# Patient Record
Sex: Female | Born: 1985 | Race: Black or African American | Hispanic: No | Marital: Single | Smoking: Current every day smoker
Health system: Southern US, Community
[De-identification: ages and names within clinical notes are randomized; demographics above are authoritative.]

## PROBLEM LIST (undated history)

## (undated) DIAGNOSIS — O24419 Gestational diabetes mellitus in pregnancy, unspecified control: Secondary | ICD-10-CM

## (undated) DIAGNOSIS — D649 Anemia, unspecified: Secondary | ICD-10-CM

## (undated) DIAGNOSIS — F419 Anxiety disorder, unspecified: Secondary | ICD-10-CM

## (undated) DIAGNOSIS — N809 Endometriosis, unspecified: Secondary | ICD-10-CM

## (undated) DIAGNOSIS — B059 Measles without complication: Secondary | ICD-10-CM

## (undated) DIAGNOSIS — O99345 Other mental disorders complicating the puerperium: Secondary | ICD-10-CM

## (undated) DIAGNOSIS — F53 Postpartum depression: Secondary | ICD-10-CM

## (undated) HISTORY — DX: Other mental disorders complicating the puerperium: O99.345

## (undated) HISTORY — DX: Gestational diabetes mellitus in pregnancy, unspecified control: O24.419

## (undated) HISTORY — PX: WISDOM TOOTH EXTRACTION: SHX21

## (undated) HISTORY — DX: Postpartum depression: F53.0

## (undated) HISTORY — DX: Anxiety disorder, unspecified: F41.9

---
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
## 2007-08-03 ENCOUNTER — Emergency Department: Payer: Self-pay | Admitting: Internal Medicine

## 2007-09-18 ENCOUNTER — Emergency Department: Payer: Self-pay | Admitting: Emergency Medicine

## 2009-02-05 ENCOUNTER — Emergency Department: Payer: Self-pay | Admitting: Emergency Medicine

## 2010-03-03 ENCOUNTER — Emergency Department: Payer: Self-pay | Admitting: Emergency Medicine

## 2010-05-31 ENCOUNTER — Ambulatory Visit: Payer: Self-pay | Admitting: Family Medicine

## 2010-09-03 ENCOUNTER — Encounter: Payer: Self-pay | Admitting: Family Medicine

## 2010-09-15 ENCOUNTER — Encounter: Payer: Self-pay | Admitting: Family Medicine

## 2010-09-15 DIAGNOSIS — N809 Endometriosis, unspecified: Secondary | ICD-10-CM

## 2010-09-15 HISTORY — DX: Endometriosis, unspecified: N80.9

## 2010-11-13 ENCOUNTER — Emergency Department: Payer: Self-pay | Admitting: Internal Medicine

## 2011-04-21 ENCOUNTER — Emergency Department: Payer: Self-pay | Admitting: Emergency Medicine

## 2012-04-18 ENCOUNTER — Emergency Department: Payer: Self-pay | Admitting: Emergency Medicine

## 2012-04-18 LAB — URINALYSIS, COMPLETE
Bilirubin,UR: NEGATIVE
Ketone: NEGATIVE
Leukocyte Esterase: NEGATIVE
Ph: 6 (ref 4.5–8.0)
Protein: NEGATIVE
RBC,UR: 1 /HPF (ref 0–5)
Specific Gravity: 1.025 (ref 1.003–1.030)
Squamous Epithelial: 4

## 2012-07-08 ENCOUNTER — Ambulatory Visit: Payer: Self-pay

## 2012-07-25 ENCOUNTER — Emergency Department: Payer: Self-pay | Admitting: Emergency Medicine

## 2012-11-05 ENCOUNTER — Emergency Department: Payer: Self-pay | Admitting: Emergency Medicine

## 2013-05-22 ENCOUNTER — Emergency Department: Payer: Self-pay | Admitting: Internal Medicine

## 2013-06-26 ENCOUNTER — Emergency Department: Payer: Self-pay | Admitting: Emergency Medicine

## 2013-08-21 ENCOUNTER — Emergency Department: Payer: Self-pay | Admitting: Emergency Medicine

## 2013-09-16 ENCOUNTER — Emergency Department: Payer: Self-pay | Admitting: Emergency Medicine

## 2013-09-16 LAB — COMPREHENSIVE METABOLIC PANEL
ALBUMIN: 3.7 g/dL (ref 3.4–5.0)
ALK PHOS: 63 U/L
Anion Gap: 3 — ABNORMAL LOW (ref 7–16)
BUN: 11 mg/dL (ref 7–18)
Bilirubin,Total: 0.4 mg/dL (ref 0.2–1.0)
CHLORIDE: 106 mmol/L (ref 98–107)
Calcium, Total: 9.5 mg/dL (ref 8.5–10.1)
Co2: 27 mmol/L (ref 21–32)
Creatinine: 0.71 mg/dL (ref 0.60–1.30)
EGFR (African American): >60
EGFR (Non-African Amer.): >60
Glucose: 86 mg/dL (ref 65–99)
OSMOLALITY: 271 (ref 275–301)
Potassium: 3.2 mmol/L — ABNORMAL LOW (ref 3.5–5.1)
SGOT(AST): 22 U/L (ref 15–37)
SGPT (ALT): 26 U/L (ref 12–78)
Sodium: 136 mmol/L (ref 136–145)
Total Protein: 7.6 g/dL (ref 6.4–8.2)

## 2013-09-16 LAB — URINALYSIS, COMPLETE
BACTERIA: NONE SEEN
Bilirubin,UR: NEGATIVE
Blood: NEGATIVE
Glucose,UR: NEGATIVE mg/dL (ref 0–75)
KETONE: NEGATIVE
Nitrite: NEGATIVE
Ph: 5 (ref 4.5–8.0)
Protein: NEGATIVE
Specific Gravity: 1.026 (ref 1.003–1.030)

## 2013-09-16 LAB — CBC WITH DIFFERENTIAL/PLATELET
BASOS ABS: 0 10*3/uL (ref 0.0–0.1)
Basophil %: 0.5 %
EOS ABS: 0.1 10*3/uL (ref 0.0–0.7)
Eosinophil %: 0.9 %
HCT: 36 % (ref 35.0–47.0)
HGB: 12.2 g/dL (ref 12.0–16.0)
LYMPHS PCT: 39.5 %
Lymphocyte #: 3.2 10*3/uL (ref 1.0–3.6)
MCH: 29.6 pg (ref 26.0–34.0)
MCHC: 33.8 g/dL (ref 32.0–36.0)
MCV: 87 fL (ref 80–100)
MONO ABS: 0.6 x10 3/mm (ref 0.2–0.9)
Monocyte %: 7 %
Neutrophil #: 4.2 10*3/uL (ref 1.4–6.5)
Neutrophil %: 52.1 %
Platelet: 251 10*3/uL (ref 150–440)
RBC: 4.12 10*6/uL (ref 3.80–5.20)
RDW: 13.1 % (ref 11.5–14.5)
WBC: 8 10*3/uL (ref 3.6–11.0)

## 2013-09-16 LAB — LIPASE, BLOOD: Lipase: 163 U/L (ref 73–393)

## 2013-11-23 ENCOUNTER — Emergency Department: Payer: Self-pay | Admitting: Emergency Medicine

## 2014-03-23 ENCOUNTER — Ambulatory Visit: Payer: Self-pay

## 2014-04-15 ENCOUNTER — Ambulatory Visit: Payer: Self-pay

## 2014-05-12 ENCOUNTER — Observation Stay: Payer: Self-pay | Admitting: Obstetrics and Gynecology

## 2014-05-14 ENCOUNTER — Observation Stay: Payer: Self-pay

## 2014-05-15 ENCOUNTER — Inpatient Hospital Stay: Payer: Self-pay

## 2014-05-15 LAB — CBC WITH DIFFERENTIAL/PLATELET
BASOS ABS: 0 10*3/uL (ref 0.0–0.1)
Basophil %: 0.3 %
EOS ABS: 0 10*3/uL (ref 0.0–0.7)
EOS PCT: 0.1 %
HCT: 32.8 % — AB (ref 35.0–47.0)
HGB: 10.2 g/dL — AB (ref 12.0–16.0)
Lymphocyte #: 1.6 10*3/uL (ref 1.0–3.6)
Lymphocyte %: 15.9 %
MCH: 26.2 pg (ref 26.0–34.0)
MCHC: 31.1 g/dL — ABNORMAL LOW (ref 32.0–36.0)
MCV: 84 fL (ref 80–100)
Monocyte #: 0.9 x10 3/mm (ref 0.2–0.9)
Monocyte %: 9.2 %
NEUTROS PCT: 74.5 %
Neutrophil #: 7.6 10*3/uL — ABNORMAL HIGH (ref 1.4–6.5)
PLATELETS: 241 10*3/uL (ref 150–440)
RBC: 3.9 10*6/uL (ref 3.80–5.20)
RDW: 15.1 % — ABNORMAL HIGH (ref 11.5–14.5)
WBC: 10.2 10*3/uL (ref 3.6–11.0)

## 2014-05-17 LAB — HEMATOCRIT: HCT: 27 % — ABNORMAL LOW (ref 35.0–47.0)

## 2015-01-06 NOTE — Op Note (Signed)
PATIENT NAME:  Jean Mckay, Jakerra MR#:  [id number] DATE OF BIRTH:  Oct 26, 1985  DATE OF PROCEDURE:  05/16/2014  PREOPERATIVE DIAGNOSIS:  Term intrauterine pregnancy, failure to progress.  POSTOPERATIVE DIAGNOSIS: Term intrauterine pregnancy, failure to progress.  PROCEDURE:   1.  Low transverse cesarean section.  2.  Placement of On-Q pain pump.   SURGEON: Dierdre Searles. Paul Harris, MD   ASSISTANT: S. [address]  ANESTHESIA: Epidural.   ESTIMATED BLOOD LOSS: 500 mL.   COMPLICATIONS: None.   FINDINGS: Normal tubes, ovaries, and uterus. Viable female infant weighing 8 pounds with Apgar scores of 8, 6, and 8 at one, five, and ten minutes, respectively.   DISPOSITION: To the recovery room in stable condition.   TECHNIQUE: The patient is prepped and draped in the usual sterile fashion after adequate anesthesia is obtained, in the supine position on the operating room table. Scalpel was used to create a low transverse skin incision down to the level of the rectus fascia which was then dissected bilaterally using Mayo scissors. Rectus muscles are separated from the midline. The peritoneum is penetrated and the bladder is inferiorly dissected and retracted. A scalpel was used to create a low transverse hysterotomy incision that is then extended by blunt dissection. The amniotomy reveals clear fluid. The infant's head is grasped and delivered without complication. A nuchal cord is reduced. The remaining portion of the infant is then delivered.   Cord blood is obtained and placenta is manually extracted. The uterus is externalized and cleansed of all membranes and debris using a moist sponge. Hysterotomy incision is closed with running 1 Vicryl suture in a locking fashion, followed by a second layer to imbricate the first layer with excellent hemostasis noted. The uterus is placed back in the intra-abdominal cavity and the paracolic gutters are irrigated with warm saline. Re-examination of the incision reveals  excellent hemostasis. The peritoneum is closed. Trocars are placed through the abdomen into the subfascial space and the SilverSoaker catheters associated with the On-Q pain pump were then placed. The rectus fascia was closed with a 0 Maxon suture. Subcutaneous tissues are irrigated and hemostasis is assured using electrocautery. Skin is closed with a 4-0 Vicryl suture in a subcuticular fashion followed by placement of Dermabond. The catheters are flushed with 5 mL each of bupivacaine and then stabilized into place with Steri-Strips and a Tegaderm bandage. The patient tolerates the procedure well and goes to the recovery room in stable condition. All sponge, instrument, and needle counts are correct    ____________________________ R. Annamarie MajorPaul Harris, MD rph:LT D: 05/16/2014 18:32:20 ET T: 05/16/2014 21:54:17 ET JOB#: [id number]  cc: Dierdre Searles. Paul Harris, MD, <Dictator> Nadara MustardOBERT P HARRIS MD ELECTRONICALLY SIGNED 05/17/2014 5:28

## 2015-01-23 NOTE — H&P (Signed)
L&D Evaluation:  History Expanded:  HPI 29 yo G2P1001 at 3195w3d gestational age by LMP consistent with 9 wk ultrasound. Pregnancy complicated by GDM (White classification A1), obesity with BMI 31.  She has not consistently brought in her blood glucose log, but states that her values are typically normal.  SHe presents "just feeling bad" all day today. She began having cramps yesterday.  She notes positive fetal movement, no leakage of fluid, no vaginal bleeding.  Last growth ultrasound was on 8/14 with growth 41st %ile, AFI 17.   Blood Type (Maternal) B positive   Group B Strep Results Maternal (Result >5wks must be treated as unknown) positive   Maternal HIV Negative   Maternal Syphilis Ab Nonreactive   Maternal Varicella Immune   Rubella Results (Maternal) immune   EDC 17-May-2014   Patient's Medical History 1) anxiety, 2) postpartum depression   Patient's Surgical History none   Medications Pre Natal Vitamins   Allergies NKDA   Social History former tobacco, denies EtOH and drugs   Family History Non-Contributory   ROS:  ROS All systems were reviewed.  HEENT, CNS, GI, GU, Respiratory, CV, Renal and Musculoskeletal systems were found to be normal., unless otherwise noted in HPI   Exam:  Vital Signs BP 119/70, P85   General no apparent distress   Mental Status clear   Chest moves air well   Heart normal sinus rhythm   Abdomen gravid, non-tender   Fetal Position ceph   Back no CVAT   Edema no edema   Pelvic no external lesions, 4/50/-2 per RN   Mebranes Intact   FHT normal rate with no decels   FHT Description baseline 125/mod var/+ 15x15 accels/no decels   Ucx not tracing well   Skin no lesions   Impression:  Impression 1) Intrauterine pregnancy at 1195w3d gestational age, 2) 51DMA1, 3) rule out labor   Plan:  Plan monitor contractions and for cervical change   Electronic Signatures: Conard NovakJackson, Rosealyn Little D (MD)  (Signed 28-Aug-15 23:07)  Authored:  L&D Evaluation   Last Updated: 28-Aug-15 23:07 by Conard NovakJackson, Dianne Bady D (MD)

## 2015-01-23 NOTE — H&P (Signed)
L&D Evaluation:  History:  HPI 29 yo G2P1001 at 8561w6d gestational age by LMP consistent with 9 wk ultrasound. Pregnancy complicated by GDM (White classification A1), obesity with BMI 31.  She has not consistently brought in her blood glucose log, but states that her values are typically normal.  Last growth ultrasound was on 8/14 with growth 41st %ile, AFI 17. She presents today after being sent over from the office for IOL for diet controlled GDM. Her prenatal labs are: B+, RI, VI, GBS positive. She reports +FM, and some occassional braxton hicks contractions, denies vb or lof.   Presents with other, IOL for GDM   Patient's Medical History 1) anxiety, 2) postpartum depression   Patient's Surgical History none   Medications Pre Natal Vitamins   Allergies NKDA   Social History former tobacco, denies EtOH and drugs   Family History Non-Contributory   ROS:  ROS All systems were reviewed.  HEENT, CNS, GI, GU, Respiratory, CV, Renal and Musculoskeletal systems were found to be normal., unless otherwise noted in HPI   Exam:  Vital Signs stable   General no apparent distress   Mental Status clear   Heart normal sinus rhythm   Abdomen gravid, non-tender   Back no CVAT   Edema no edema   Pelvic no external lesions, 3-4/50/-2 per RN consistant with exam in office today   Mebranes Intact   FHT normal rate with no decels   FHT Description baseline 140's +accels, no decels noted   Ucx irregular, every 8   Skin dry, no lesions, no rashes   Lymph no lymphadenopathy   Impression:  Impression 1) Intrauterine pregnancy at 861w6d gestational age, 2) 39DMA1, 143) Cat 1 FHT   Plan:  Plan EFM/NST, antibiotics for GBBS prophylaxis, Start pitocin for IOL, anticipate SVD   Follow Up Appointment need to schedule   Electronic Signatures: Jannet MantisSubudhi, Khylan Sawyer (CNM)  (Signed 31-Aug-15 17:14)  Authored: L&D Evaluation   Last Updated: 31-Aug-15 17:14 by Jannet MantisSubudhi, Jorrell Kuster (CNM)

## 2015-05-12 ENCOUNTER — Emergency Department
Admission: EM | Admit: 2015-05-12 | Discharge: 2015-05-12 | Disposition: A | Discharge status: Home / Self Care | Payer: Self-pay | Attending: Emergency Medicine | Admitting: Emergency Medicine

## 2015-05-12 ENCOUNTER — Encounter: Payer: Self-pay | Admitting: Emergency Medicine

## 2015-05-12 ENCOUNTER — Emergency Department: Payer: Self-pay

## 2015-05-12 DIAGNOSIS — Z3202 Encounter for pregnancy test, result negative: Secondary | ICD-10-CM | POA: Insufficient documentation

## 2015-05-12 DIAGNOSIS — N83202 Unspecified ovarian cyst, left side: Secondary | ICD-10-CM

## 2015-05-12 DIAGNOSIS — K5901 Slow transit constipation: Secondary | ICD-10-CM | POA: Insufficient documentation

## 2015-05-12 DIAGNOSIS — N8320 Unspecified ovarian cysts: Secondary | ICD-10-CM | POA: Insufficient documentation

## 2015-05-12 DIAGNOSIS — R109 Unspecified abdominal pain: Secondary | ICD-10-CM

## 2015-05-12 LAB — URINALYSIS COMPLETE WITH MICROSCOPIC (ARMC ONLY)
Bilirubin Urine: NEGATIVE
GLUCOSE, UA: NEGATIVE mg/dL
Ketones, ur: NEGATIVE mg/dL
Leukocytes, UA: NEGATIVE
NITRITE: NEGATIVE
Protein, ur: NEGATIVE mg/dL
Specific Gravity, Urine: 1.025 (ref 1.005–1.030)
pH: 6 (ref 5.0–8.0)

## 2015-05-12 LAB — POCT PREGNANCY, URINE: PREG TEST UR: NEGATIVE

## 2015-05-12 MED ORDER — KETOROLAC TROMETHAMINE 60 MG/2ML IM SOLN
60.0000 mg | Freq: Once | INTRAMUSCULAR | Status: AC
  Administered 2015-05-12: 60 mg via INTRAMUSCULAR
  Filled 2015-05-12: qty 2

## 2015-05-12 MED ORDER — ONDANSETRON 8 MG PO TBDP
8.0000 mg | ORAL_TABLET | Freq: Once | ORAL | Status: AC
  Administered 2015-05-12: 8 mg via ORAL
  Filled 2015-05-12: qty 1

## 2015-05-12 MED ORDER — DOCUSATE SODIUM 100 MG PO CAPS: 100.0000 mg | ORAL_CAPSULE | Freq: Every day | ORAL | Status: AC | PRN

## 2015-05-12 MED ORDER — TRAMADOL HCL 50 MG PO TABS
50.0000 mg | ORAL_TABLET | Freq: Once | ORAL | Status: AC
  Administered 2015-05-12: 50 mg via ORAL
  Filled 2015-05-12: qty 1

## 2015-05-12 MED ORDER — IBUPROFEN 800 MG PO TABS: 800.0000 mg | ORAL_TABLET | Freq: Three times a day (TID) | ORAL | Status: DC | PRN

## 2015-05-12 MED ORDER — OXYCODONE-ACETAMINOPHEN 5-325 MG PO TABS: 1.0000 | ORAL_TABLET | Freq: Four times a day (QID) | ORAL | Status: DC | PRN

## 2015-05-12 NOTE — ED Notes (Addendum)
Pt with left flank pain that came on suddenly. Denies changes in urination or bowel habits. Appears in acute pain. Pain not relieved by position. Left flank tender to touch. Pain improves by not moving or breathing

## 2015-05-12 NOTE — ED Provider Notes (Signed)
[facility] Emergency Department Provider Note  ____________________________________________  Time seen: Approximately 12:20 PM  I have reviewed the triage vital signs and the nursing notes.   HISTORY  Chief Complaint Back Pain    HPI Jean Mckay is a 29 y.o. female acute onset of left flank pain this morning. Patient denies any urinary complaints. Patient states movement increases the pain and keep instilled decreases the pain. Patient denies any radicular component to this complaint. She denies any bowel dysfunction. Patient is rating the pain as a 9/10 describe the sharp.Patient also state menstrual cycle finished yesterday.   History reviewed. No pertinent past medical history.  There are no active problems to display for this patient.   History reviewed. No pertinent past surgical history.  No current outpatient prescriptions on file.  Allergies Pork-derived products  History reviewed. No pertinent family history.  Social History Social History  Substance Use Topics  . Smoking status: Never Smoker   . Smokeless tobacco: None  . Alcohol Use: No    Review of Systems Constitutional: No fever/chills Eyes: No visual changes. ENT: No sore throat. Cardiovascular: Denies chest pain. Respiratory: Denies shortness of breath. Gastrointestinal: No abdominal pain.  No nausea, no vomiting.  No diarrhea.  No constipation. Genitourinary: Negative for dysuria. Musculoskeletal: Left flank pain Skin: Negative for rash. Neurological: Negative for headaches, focal weakness or numbness. 10-point ROS otherwise negative.  ____________________________________________   PHYSICAL EXAM:  VITAL SIGNS: ED Triage Vitals  Enc Vitals Group     BP 05/12/15 1146 118/86 mmHg     Pulse Rate 05/12/15 1146 73     Resp 05/12/15 1146 20     Temp 05/12/15 1146 98.2 F (36.8 C)     Temp Source 05/12/15 1146 Oral     SpO2 05/12/15 1146 100 %     Weight  05/12/15 1146 122 lb (55.339 kg)     Height 05/12/15 1146 5' (1.524 m)     Head Cir --      Peak Flow --      Pain Score 05/12/15 1142 9     Pain Loc --      Pain Edu? --      Excl. in GC? --     Constitutional: Alert and oriented. Well appearing and in no acute distress. Eyes: Conjunctivae are normal. PERRL. EOMI. Head: Atraumatic. Nose: No congestion/rhinnorhea. Mouth/Throat: Mucous membranes are moist.  Oropharynx non-erythematous. Neck: No stridor.  No cervical spine tenderness to palpation. Hematological/Lymphatic/Immunilogical: No cervical lymphadenopathy. Cardiovascular: Normal rate, regular rhythm. Grossly normal heart sounds.  Good peripheral circulation. Respiratory: Normal respiratory effort.  No retractions. Lungs CTAB. Gastrointestinal: Soft and nontender. No distention. No abdominal bruits. No CVA tenderness. Genitourinary:  Musculoskeletal: No lower extremity tenderness nor edema.  No joint effusions. Neurologic:  Normal speech and language. No gross focal neurologic deficits are appreciated. No gait instability. Skin:  Skin is warm, dry and intact. No rash noted. Psychiatric: Mood and affect are normal. Speech and behavior are normal.  ____________________________________________   LABS (all labs ordered are listed, but only abnormal results are displayed)  Labs Reviewed  URINALYSIS COMPLETEWITH MICROSCOPIC ([facility] ONLY) - Abnormal; Notable for the following:    Color, Urine YELLOW (*)    APPearance CLEAR (*)    Hgb urine dipstick 2+ (*)    Bacteria, UA RARE (*)    Squamous Epithelial / LPF 0-5 (*)    All other components within normal limits  POC URINE PREG, ED  POCT PREGNANCY,  URINE   ____________________________________________  EKG   ____________________________________________  RADIOLOGY  CT renal scan negative for kidney stones. Patient found to have a left ovarian cyst. Patient also has increased stool in the left  colon. ____________________________________________   PROCEDURES  Procedure(s) performed: None  Critical Care performed: No  ____________________________________________   INITIAL IMPRESSION / ASSESSMENT AND PLAN / ED COURSE  Pertinent labs & imaging results that were available during my care of the patient were reviewed by me and considered in my medical decision making (see chart for details).  Left ovarian cyst. Mild to moderate constipation. Patient advised to follow-up with GYN for further evaluation and treatment of a left ovarian cyst. ____________________________________________   FINAL CLINICAL IMPRESSION(S) / ED DIAGNOSES  Final diagnoses:  Acute flank pain  Cyst of left ovary  Constipation by delayed colonic transit      Joni Reining, PA-C 05/12/15 1404  Minna Antis, MD 05/12/15 [phone]

## 2015-05-12 NOTE — ED Notes (Signed)
Reports lower back pain onset this am at work, worse with movement

## 2015-06-12 IMAGING — CT CT HEAD WITHOUT CONTRAST
2 series · 17 of 30 positions shown, 20 images · non-contrast
Comparison: none

REASON FOR EXAM: ha after fall
COMMENTS:

[Series 2: soft tissue · axial · 0.45mm/px · z∈[-158,-88]mm · 5 of 31 slices shown]
[im 3/31  brain]
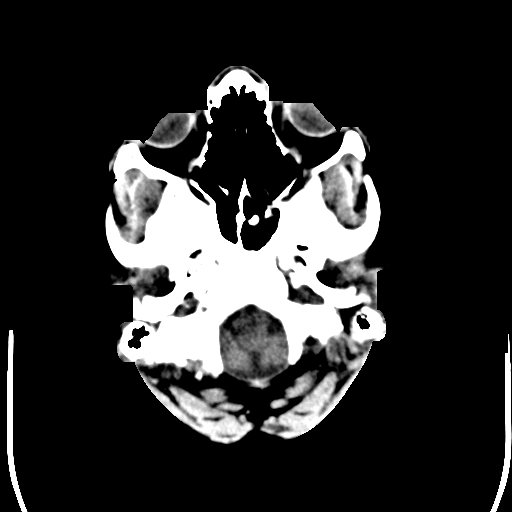
[im 7/31  brain]
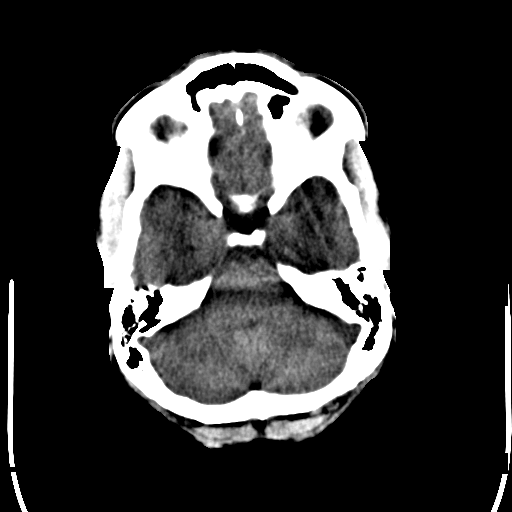
[im 11/31  brain]
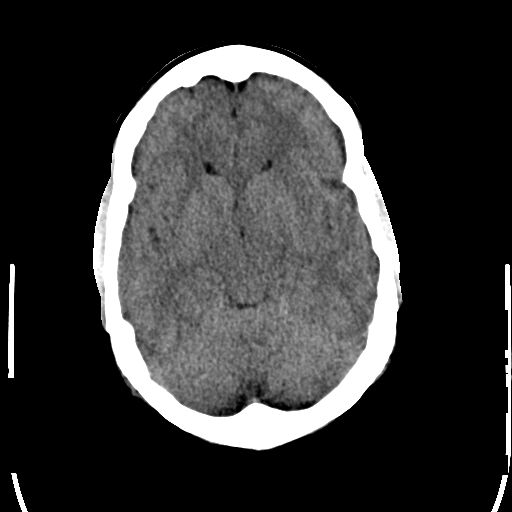
[im 15/31  brain]
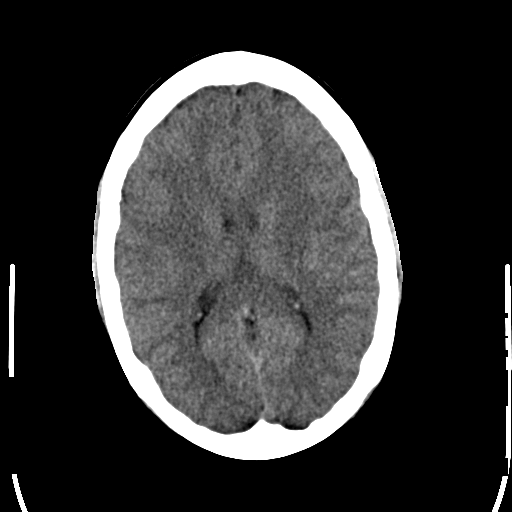
[im 17/31  brain]
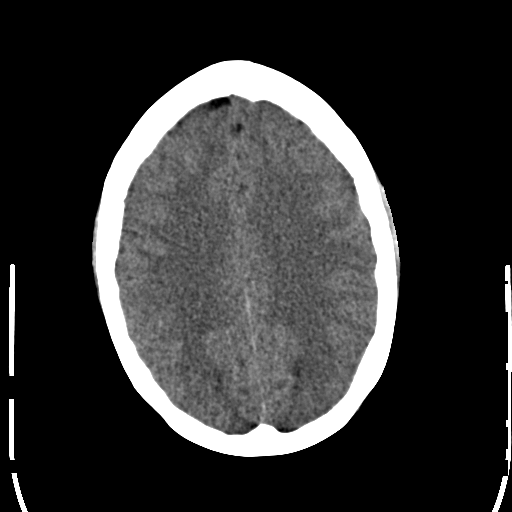

[Series 5: soft (id) · axial · 0.45mm/px · z∈[-142,-28]mm · 12 of 29 slices shown, 15 images]
[im 3/29  brain]
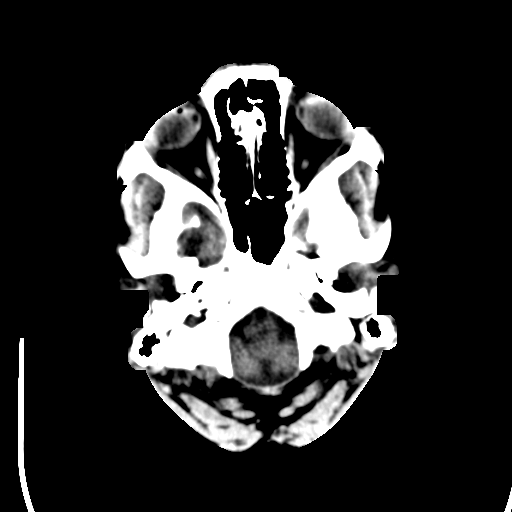
[im 3/29  bone]
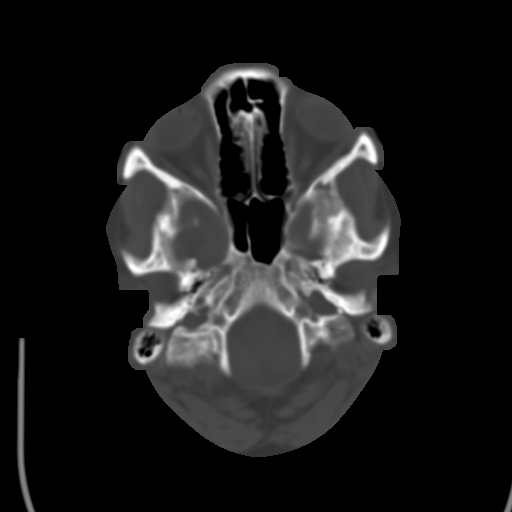
[im 5/29  brain]
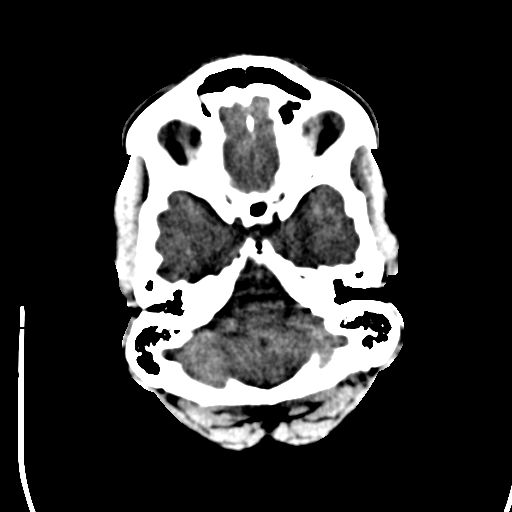
[im 7/29  brain]
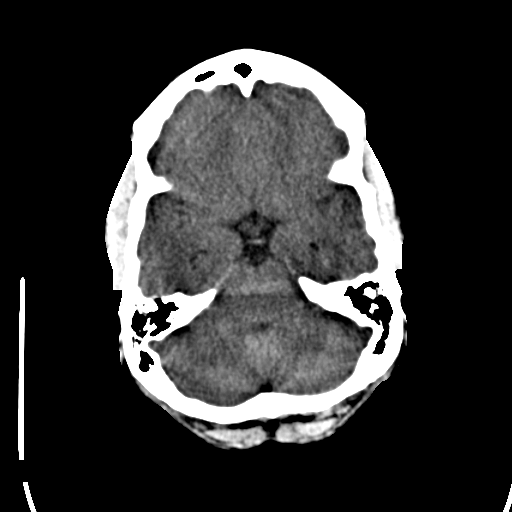
[im 9/29  brain]
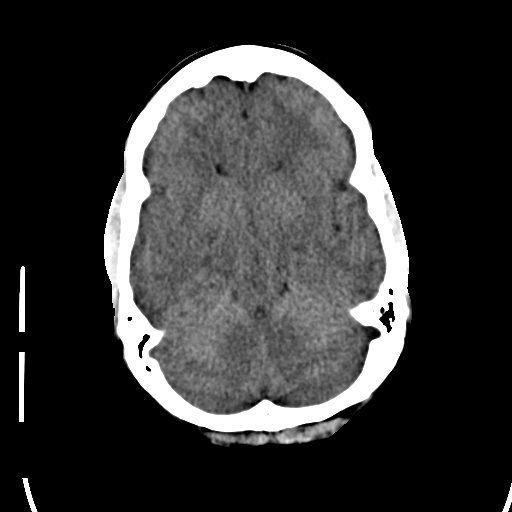
[im 11/29  brain]
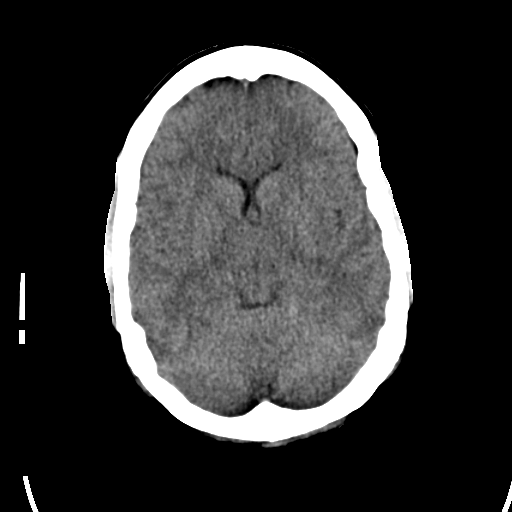
[im 11/29  bone]
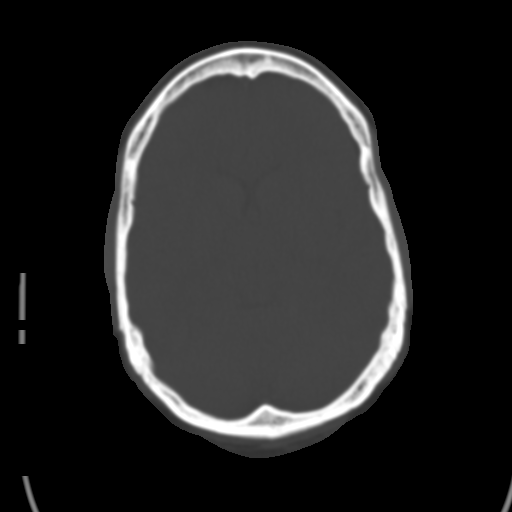
[im 13/29  brain]
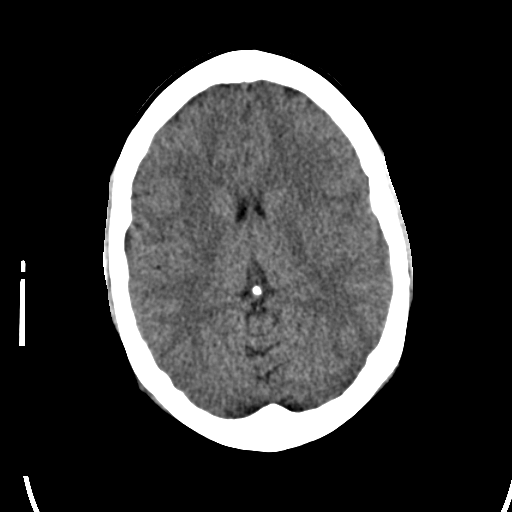
[im 16/29  brain]
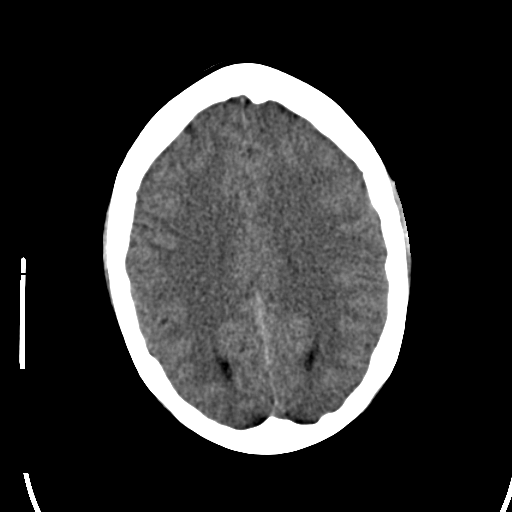
[im 18/29  brain]
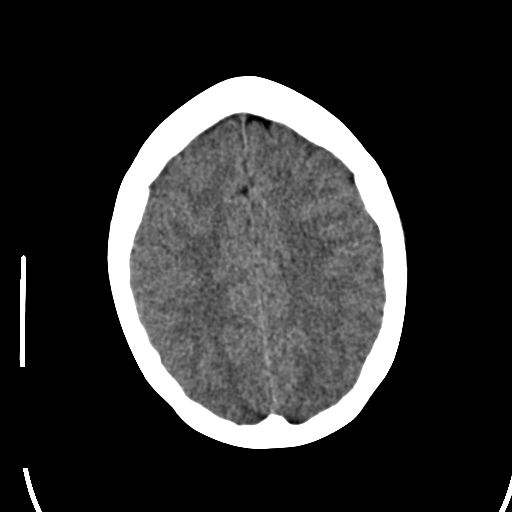
[im 20/29  brain]
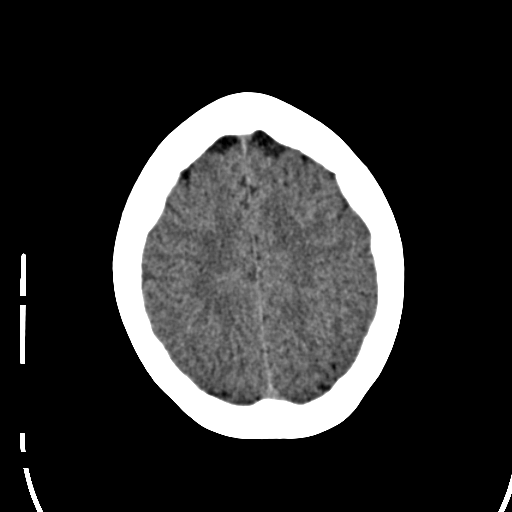
[im 20/29  bone]
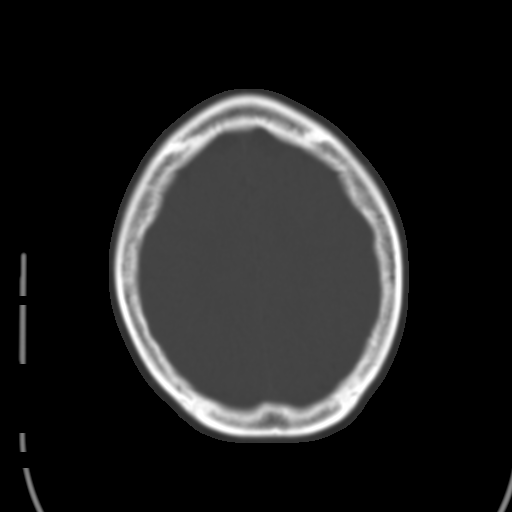
[im 22/29  brain]
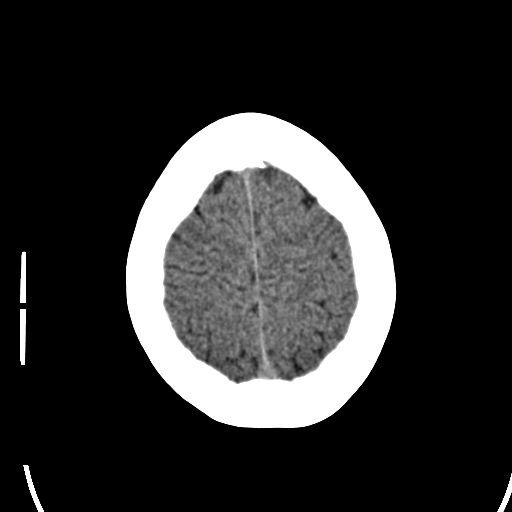
[im 24/29  brain]
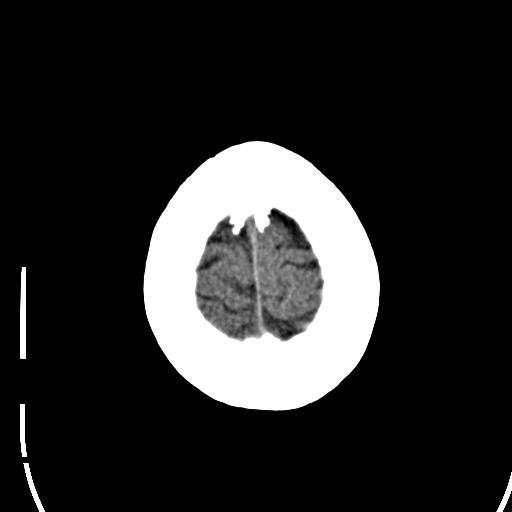
[im 26/29  brain]
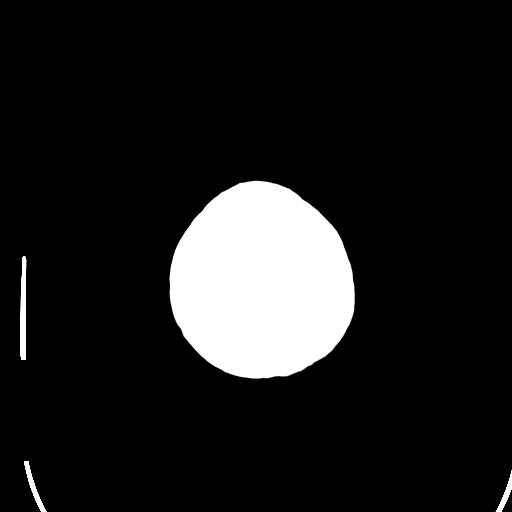

[17 of 30 positions shown; findings below may reference images not displayed]

PROCEDURE:     CT  - CT HEAD WITHOUT CONTRAST  - May 22, 2013 [DATE]

RESULT:     Noncontrast emergent CT of the brain is compared to an earlier
exam of 11/13/2010. The ventricles and sulci are normal. There is no
hemorrhage. There is no focal mass, mass-effect or midline shift. There is
no evidence of edema or territorial infarct. The bone windows demonstrate
normal aeration of the paranasal sinuses and mastoid air cells. There is no
skull fracture demonstrated.
IMPRESSION: 1. No acute intracranial abnormality. Stable appearance.

[REDACTED]

## 2015-07-23 ENCOUNTER — Encounter: Payer: Self-pay | Admitting: Emergency Medicine

## 2015-07-23 ENCOUNTER — Emergency Department
Admission: EM | Admit: 2015-07-23 | Discharge: 2015-07-23 | Disposition: A | Discharge status: Home / Self Care | Payer: Self-pay | Attending: Emergency Medicine | Admitting: Emergency Medicine

## 2015-07-23 DIAGNOSIS — R0981 Nasal congestion: Secondary | ICD-10-CM | POA: Insufficient documentation

## 2015-07-23 DIAGNOSIS — Z3202 Encounter for pregnancy test, result negative: Secondary | ICD-10-CM | POA: Insufficient documentation

## 2015-07-23 DIAGNOSIS — R0789 Other chest pain: Secondary | ICD-10-CM | POA: Insufficient documentation

## 2015-07-23 DIAGNOSIS — J029 Acute pharyngitis, unspecified: Secondary | ICD-10-CM | POA: Insufficient documentation

## 2015-07-23 LAB — URINALYSIS COMPLETE WITH MICROSCOPIC (ARMC ONLY)
BACTERIA UA: NONE SEEN
BILIRUBIN URINE: NEGATIVE
GLUCOSE, UA: NEGATIVE mg/dL
Ketones, ur: NEGATIVE mg/dL
LEUKOCYTES UA: NEGATIVE
Nitrite: NEGATIVE
Protein, ur: NEGATIVE mg/dL
Specific Gravity, Urine: 1.023 (ref 1.005–1.030)
pH: 5 (ref 5.0–8.0)

## 2015-07-23 LAB — COMPREHENSIVE METABOLIC PANEL
ALK PHOS: 54 U/L (ref 38–126)
ALT: 16 U/L (ref 14–54)
ANION GAP: 5 (ref 5–15)
AST: 15 U/L (ref 15–41)
Albumin: 3.8 g/dL (ref 3.5–5.0)
BILIRUBIN TOTAL: 0.5 mg/dL (ref 0.3–1.2)
BUN: 8 mg/dL (ref 6–20)
CALCIUM: 9 mg/dL (ref 8.9–10.3)
CO2: 29 mmol/L (ref 22–32)
CREATININE: 0.7 mg/dL (ref 0.44–1.00)
Chloride: 107 mmol/L (ref 101–111)
Glucose, Bld: 97 mg/dL (ref 65–99)
Potassium: 3.4 mmol/L — ABNORMAL LOW (ref 3.5–5.1)
SODIUM: 141 mmol/L (ref 135–145)
TOTAL PROTEIN: 7.4 g/dL (ref 6.5–8.1)

## 2015-07-23 LAB — CBC WITH DIFFERENTIAL/PLATELET
Basophils Absolute: 0 10*3/uL (ref 0–0.1)
Basophils Relative: 0 %
EOS ABS: 0.1 10*3/uL (ref 0–0.7)
Eosinophils Relative: 1 %
HEMATOCRIT: 36.9 % (ref 35.0–47.0)
HEMOGLOBIN: 12.2 g/dL (ref 12.0–16.0)
LYMPHS ABS: 3.1 10*3/uL (ref 1.0–3.6)
LYMPHS PCT: 33 %
MCH: 28.9 pg (ref 26.0–34.0)
MCHC: 32.9 g/dL (ref 32.0–36.0)
MCV: 87.7 fL (ref 80.0–100.0)
MONOS PCT: 9 %
Monocytes Absolute: 0.8 10*3/uL (ref 0.2–0.9)
NEUTROS ABS: 5.5 10*3/uL (ref 1.4–6.5)
NEUTROS PCT: 57 %
Platelets: 177 10*3/uL (ref 150–440)
RBC: 4.21 MIL/uL (ref 3.80–5.20)
RDW: 14.4 % (ref 11.5–14.5)
WBC: 9.5 10*3/uL (ref 3.6–11.0)

## 2015-07-23 LAB — POCT RAPID STREP A: Streptococcus, Group A Screen (Direct): NEGATIVE

## 2015-07-23 LAB — POCT PREGNANCY, URINE: Preg Test, Ur: NEGATIVE

## 2015-07-23 LAB — LIPASE, BLOOD: Lipase: 31 U/L (ref 11–51)

## 2015-07-23 MED ORDER — TRAMADOL HCL 50 MG PO TABS: 50.0000 mg | ORAL_TABLET | Freq: Two times a day (BID) | ORAL | Status: DC

## 2015-07-23 MED ORDER — TRAMADOL HCL 50 MG PO TABS
50.0000 mg | ORAL_TABLET | Freq: Once | ORAL | Status: AC
  Administered 2015-07-23: 50 mg via ORAL
  Filled 2015-07-23: qty 1

## 2015-07-23 MED ORDER — CYCLOBENZAPRINE HCL 5 MG PO TABS: 5.0000 mg | ORAL_TABLET | Freq: Three times a day (TID) | ORAL | Status: DC | PRN

## 2015-07-23 MED ORDER — NAPROXEN 500 MG PO TBEC: 500.0000 mg | DELAYED_RELEASE_TABLET | Freq: Two times a day (BID) | ORAL | Status: DC

## 2015-07-23 NOTE — Discharge Instructions (Signed)
Chest Wall Pain Chest wall pain is pain in or around the bones and muscles of your chest. Sometimes, an injury causes this pain. Sometimes, the cause may not be known. This pain may take several weeks or longer to get better. HOME CARE INSTRUCTIONS  Pay attention to any changes in your symptoms. Take these actions to help with your pain:   Rest as told by your health care provider.   Avoid activities that cause pain. These include any activities that use your chest muscles or your abdominal and side muscles to lift heavy items.   If directed, apply ice to the painful area:  Put ice in a plastic bag.  Place a towel between your skin and the bag.  Leave the ice on for 20 minutes, 2-3 times per day.  Take over-the-counter and prescription medicines only as told by your health care provider.  Do not use tobacco products, including cigarettes, chewing tobacco, and e-cigarettes. If you need help quitting, ask your health care provider.  Keep all follow-up visits as told by your health care provider. This is important. SEEK MEDICAL CARE IF:  You have a fever.  Your chest pain becomes worse.  You have new symptoms. SEEK IMMEDIATE MEDICAL CARE IF:  You have nausea or vomiting.  You feel sweaty or light-headed.  You have a cough with phlegm (sputum) or you cough up blood.  You develop shortness of breath.   This information is not intended to replace advice given to you by your health care provider. Make sure you discuss any questions you have with your health care provider.   Document Released: 09/01/2005 Document Revised: 05/23/2015 Document Reviewed: 11/27/2014 Elsevier Interactive Patient Education [employer]2016 [employer]vier Inc.   Your exam and labs are normal today.  Continue to monitor your symptoms and apply ice as needed. Return to the ED or [facility] for continued management.

## 2015-07-23 NOTE — ED Notes (Signed)
Sore throat x 1 day and right abdominal pain -- small "bump" to right flank near ribs.  Also some sinus congestion.

## 2015-07-23 NOTE — ED Provider Notes (Signed)
[facility] Emergency Department Provider Note ____________________________________________  Time seen: 1935  I have reviewed the triage vital signs and the nursing notes.  HISTORY  Chief Complaint  No chief complaint on file.  HPI Jean Mckay is a 29 y.o. female reports to the ED for evaluation ofmultiple complaints including a sore throat with duration of one day and tenderness to the right lower ribs. She is also experiencing some new onset of sinus congestion for the last 24 hours. She denies any fevers, chills, sweats, cough or congestion. She also has been without any nausea, vomiting, or diarrhea. Her last bowel movement was yesterday and there was no reported constipation. She had a similar pain to the right flank two months prior and was evaluated here with a normal CT scan. She's been dosing Alka-Seltzer and Tylenol PM for her upper respiratory symptoms since onset. She rates the pain on her right lower rib at a 9/10 in triage. There is been no report of trauma to the chest wall, slip, fall, or other injury.  History reviewed. No pertinent past medical history.  There are no active problems to display for this patient.  Past Surgical History  Procedure Laterality Date  . Cesarean section      Current Outpatient Rx  Name  Route  Sig  Dispense  Refill  . cyclobenzaprine (FLEXERIL) 5 MG tablet   Oral   Take 1 tablet (5 mg total) by mouth 3 (three) times daily as needed for muscle spasms.   15 tablet   0   . docusate sodium (COLACE) 100 MG capsule   Oral   Take 1 capsule (100 mg total) by mouth daily as needed.   10 capsule   0   . ibuprofen (ADVIL,MOTRIN) 800 MG tablet   Oral   Take 1 tablet (800 mg total) by mouth every 8 (eight) hours as needed for moderate pain.   15 tablet   0   . naproxen (EC NAPROSYN) 500 MG EC tablet   Oral   Take 1 tablet (500 mg total) by mouth 2 (two) times daily with a meal.   30 tablet   0   .  oxyCODONE-acetaminophen (ROXICET) 5-325 MG per tablet   Oral   Take 1 tablet by mouth every 6 (six) hours as needed for severe pain.   12 tablet   0   . traMADol (ULTRAM) 50 MG tablet   Oral   Take 1 tablet (50 mg total) by mouth 2 (two) times daily.   12 tablet   0    Allergies Pork-derived products  No family history on file.  Social History Social History  Substance Use Topics  . Smoking status: Never Smoker   . Smokeless tobacco: None  . Alcohol Use: No   Review of Systems  Constitutional: Negative for fever. Eyes: Negative for visual changes. ENT: Negative for sore throat. Cardiovascular: Negative for chest pain. Respiratory: Negative for shortness of breath. Gastrointestinal: Negative for abdominal pain, vomiting and diarrhea. Genitourinary: Negative for dysuria. Musculoskeletal: Negative for back pain. Skin: Negative for rash. Neurological: Negative for headaches, focal weakness or numbness. ____________________________________________  PHYSICAL EXAM:  VITAL SIGNS: ED Triage Vitals  Enc Vitals Group     BP 07/23/15 1923 113/70 mmHg     Pulse Rate 07/23/15 1923 99     Resp 07/23/15 1923 16     Temp 07/23/15 1923 98.5 F (36.9 C)     Temp Source 07/23/15 1923 Oral  SpO2 07/23/15 1923 99 %     Weight 07/23/15 1923 120 lb (54.432 kg)     Height 07/23/15 1923 5' (1.524 m)     Head Cir --      Peak Flow --      Pain Score 07/23/15 1922 9     Pain Loc --      Pain Edu? --      Excl. in GC? --    Constitutional: Alert and oriented. Well appearing and in no distress. Head: Normocephalic and atraumatic.      Eyes: Conjunctivae are normal. PERRL. Normal extraocular movements      Ears: Canals clear. TMs intact bilaterally.   Nose: No congestion/rhinorrhea.   Mouth/Throat: Mucous membranes are moist.   Neck: Supple. No thyromegaly. Hematological/Lymphatic/Immunological: No cervical lymphadenopathy. Cardiovascular: Normal rate, regular rhythm.   Respiratory: Normal respiratory effort. No wheezes/rales/rhonchi. Gastrointestinal: Soft and nontender. No distention, guarding, rebound, or organomegaly. No flank pain appreciated. Musculoskeletal: Patient with tenderness to palpation over the right anterolateral rib border. Pain is reproducible with palpation. No overlying skin changes noted. Nontender with normal range of motion in all extremities.  Neurologic:  Normal gait without ataxia. Normal speech and language. No gross focal neurologic deficits are appreciated. Skin:  Skin is warm, dry and intact. No rash noted. Psychiatric: Mood and affect are normal. Patient exhibits appropriate insight and judgment. ____________________________________________   LABS (pertinent positives/negatives) Labs Reviewed  URINALYSIS COMPLETEWITH MICROSCOPIC ([facility] ONLY) - Abnormal; Notable for the following:    Color, Urine YELLOW (*)    APPearance HAZY (*)    Hgb urine dipstick 1+ (*)    Squamous Epithelial / LPF 0-5 (*)    All other components within normal limits  COMPREHENSIVE METABOLIC PANEL - Abnormal; Notable for the following:    Potassium 3.4 (*)    All other components within normal limits  CULTURE, GROUP A STREP ([facility] ONLY)  CBC WITH DIFFERENTIAL/PLATELET  LIPASE, BLOOD  POC URINE PREG, ED  POCT RAPID STREP A  POCT PREGNANCY, URINE  ____________________________________________  PROCEDURES  Ultram 50 mg PO ____________________________________________  INITIAL IMPRESSION / ASSESSMENT AND PLAN / ED COURSE  Patient with reproducible, mechanical pain to the right lower rib border. No clinical or laboratory evidence of acute intraabdominal or respiratory process at this time. Reassurance to patient following normal lab results. She is encouraged to treat the pain with the prescribed Flexeril, Naprosyn, and Ultram as directed. She is also to apply ice to minimize symptoms. She will follow with her primary care provider or return to the ED  as needed for acutely worsening symptoms. ____________________________________________  FINAL CLINICAL IMPRESSION(S) / ED DIAGNOSES  Final diagnoses:  Anterior chest wall pain      Lissa Hoard, PA-C 07/23/15 2333  Myrna Blazer, MD 07/28/15 [phone]

## 2015-07-26 LAB — CULTURE, GROUP A STREP (THRC)

## 2015-07-28 NOTE — Progress Notes (Signed)
ED Culture Results Call Back    Culture: Group C Strep in throat  Recommendation: Amoxil 500 mg PO BID x 10 days accepted by MD Mayford KnifeWilliams.   Pharmacy has called patient and left 3 voicemails on phone number [phone], which is listed in the chart. Still awaiting CB.  Demetrius Charityeldrin D. Zarielle Cea, PharmD

## 2016-02-04 ENCOUNTER — Emergency Department
Admission: EM | Admit: 2016-02-04 | Discharge: 2016-02-04 | Disposition: A | Discharge status: Home / Self Care | Payer: Managed Care, Other (non HMO) | Attending: Emergency Medicine | Admitting: Emergency Medicine

## 2016-02-04 DIAGNOSIS — Z202 Contact with and (suspected) exposure to infections with a predominantly sexual mode of transmission: Secondary | ICD-10-CM | POA: Diagnosis present

## 2016-02-04 DIAGNOSIS — N760 Acute vaginitis: Secondary | ICD-10-CM | POA: Diagnosis not present

## 2016-02-04 DIAGNOSIS — B9689 Other specified bacterial agents as the cause of diseases classified elsewhere: Secondary | ICD-10-CM

## 2016-02-04 LAB — BASIC METABOLIC PANEL
ANION GAP: 5 (ref 5–15)
BUN: 12 mg/dL (ref 6–20)
CO2: 27 mmol/L (ref 22–32)
Calcium: 9.3 mg/dL (ref 8.9–10.3)
Chloride: 105 mmol/L (ref 101–111)
Creatinine, Ser: 0.62 mg/dL (ref 0.44–1.00)
Glucose, Bld: 85 mg/dL (ref 65–99)
POTASSIUM: 3.5 mmol/L (ref 3.5–5.1)
SODIUM: 137 mmol/L (ref 135–145)

## 2016-02-04 LAB — URINALYSIS COMPLETE WITH MICROSCOPIC (ARMC ONLY)
BILIRUBIN URINE: NEGATIVE
GLUCOSE, UA: NEGATIVE mg/dL
HGB URINE DIPSTICK: NEGATIVE
Ketones, ur: NEGATIVE mg/dL
LEUKOCYTES UA: NEGATIVE
NITRITE: NEGATIVE
Protein, ur: NEGATIVE mg/dL
SPECIFIC GRAVITY, URINE: 1.02 (ref 1.005–1.030)
pH: 7 (ref 5.0–8.0)

## 2016-02-04 LAB — WET PREP, GENITAL
SPERM: NONE SEEN
Trich, Wet Prep: NONE SEEN
WBC WET PREP: NONE SEEN
Yeast Wet Prep HPF POC: NONE SEEN

## 2016-02-04 LAB — CHLAMYDIA/NGC RT PCR (ARMC ONLY)
Chlamydia Tr: DETECTED — AB
N GONORRHOEAE: NOT DETECTED

## 2016-02-04 LAB — POCT PREGNANCY, URINE: Preg Test, Ur: NEGATIVE

## 2016-02-04 LAB — CBC
HCT: 38 % (ref 35.0–47.0)
Hemoglobin: 12.6 g/dL (ref 12.0–16.0)
MCH: 29.2 pg (ref 26.0–34.0)
MCHC: 33 g/dL (ref 32.0–36.0)
MCV: 88.5 fL (ref 80.0–100.0)
PLATELETS: 257 10*3/uL (ref 150–440)
RBC: 4.3 MIL/uL (ref 3.80–5.20)
RDW: 13.5 % (ref 11.5–14.5)
WBC: 7.1 10*3/uL (ref 3.6–11.0)

## 2016-02-04 MED ORDER — LIDOCAINE HCL (PF) 1 % IJ SOLN
INTRAMUSCULAR | Status: AC
  Filled 2016-02-04: qty 5

## 2016-02-04 MED ORDER — ONDANSETRON 4 MG PO TBDP
ORAL_TABLET | ORAL | Status: AC
  Administered 2016-02-04: 4 mg via ORAL
  Filled 2016-02-04: qty 1

## 2016-02-04 MED ORDER — AZITHROMYCIN 500 MG PO TABS
ORAL_TABLET | ORAL | Status: AC
  Administered 2016-02-04: 1000 mg via ORAL
  Filled 2016-02-04: qty 2

## 2016-02-04 MED ORDER — ONDANSETRON 4 MG PO TBDP
4.0000 mg | ORAL_TABLET | Freq: Once | ORAL | Status: AC
  Administered 2016-02-04: 4 mg via ORAL

## 2016-02-04 MED ORDER — AZITHROMYCIN 250 MG PO TABS
1000.0000 mg | ORAL_TABLET | Freq: Once | ORAL | Status: AC
  Administered 2016-02-04: 1000 mg via ORAL

## 2016-02-04 MED ORDER — METRONIDAZOLE 500 MG PO TABS: 500.0000 mg | ORAL_TABLET | Freq: Two times a day (BID) | ORAL | Status: AC

## 2016-02-04 MED ORDER — CEFTRIAXONE SODIUM 250 MG IJ SOLR
250.0000 mg | Freq: Once | INTRAMUSCULAR | Status: AC
  Administered 2016-02-04: 250 mg via INTRAMUSCULAR

## 2016-02-04 MED ORDER — CEFTRIAXONE SODIUM 250 MG IJ SOLR
INTRAMUSCULAR | Status: AC
  Administered 2016-02-04: 250 mg via INTRAMUSCULAR
  Filled 2016-02-04: qty 250

## 2016-02-04 NOTE — Discharge Instructions (Signed)

## 2016-02-04 NOTE — ED Notes (Signed)
Pt here today for STD check. Pt reports her sexual partner was diagnosed with chlamydia last Wednesday. Pt states she has had some lower abdominal cramping but no other symptoms.

## 2016-02-04 NOTE — ED Notes (Signed)
Pt was told by partner today that he has chlamydia, pt also co left sided abd pain.  Denies any symptoms or dysuria.

## 2016-02-04 NOTE — ED Provider Notes (Signed)
[facility] Emergency Department Provider Note  Time seen: 9:20 PM  I have reviewed the triage vital signs and the nursing notes.   HISTORY  Chief Complaint Exposure to STD    HPI Jean Mckay is a 30 y.o. female with no past medical history who presents the emergency department after a possible STD exposure. According to the patient her sexual partner informed her today that he was recently diagnosed with chlamydia. She admits unprotected sex with this partner. Patient states she has been having intermittent lower abdominal cramping, denies any currently. Denies any increased vaginal discharge. Denies any dysuria. Patient states she is here because she wanted to get tested for STDs.     No past medical history on file.  There are no active problems to display for this patient.   Past Surgical History  Procedure Laterality Date  . Cesarean section      Current Outpatient Rx  Name  Route  Sig  Dispense  Refill  . cyclobenzaprine (FLEXERIL) 5 MG tablet   Oral   Take 1 tablet (5 mg total) by mouth 3 (three) times daily as needed for muscle spasms.   15 tablet   0   . docusate sodium (COLACE) 100 MG capsule   Oral   Take 1 capsule (100 mg total) by mouth daily as needed.   10 capsule   0   . ibuprofen (ADVIL,MOTRIN) 800 MG tablet   Oral   Take 1 tablet (800 mg total) by mouth every 8 (eight) hours as needed for moderate pain.   15 tablet   0   . naproxen (EC NAPROSYN) 500 MG EC tablet   Oral   Take 1 tablet (500 mg total) by mouth 2 (two) times daily with a meal.   30 tablet   0   . oxyCODONE-acetaminophen (ROXICET) 5-325 MG per tablet   Oral   Take 1 tablet by mouth every 6 (six) hours as needed for severe pain.   12 tablet   0   . traMADol (ULTRAM) 50 MG tablet   Oral   Take 1 tablet (50 mg total) by mouth 2 (two) times daily.   12 tablet   0     Allergies Pork-derived products  No family history on file.  Social  History Social History  Substance Use Topics  . Smoking status: Never Smoker   . Smokeless tobacco: Not on file  . Alcohol Use: No    Review of Systems Constitutional: Negative for fever. Cardiovascular: Negative for chest pain. Respiratory: Negative for shortness of breath. Gastrointestinal: Intermittent lower abdominal cramping, denies any currently. Genitourinary: Negative for dysuria. No increase in vaginal discharge. Musculoskeletal: Negative for back pain. Neurological: Negative for headache 10-point ROS otherwise negative.  ____________________________________________   PHYSICAL EXAM:  VITAL SIGNS: ED Triage Vitals  Enc Vitals Group     BP 02/04/16 2021 123/61 mmHg     Pulse Rate 02/04/16 2021 77     Resp 02/04/16 2021 18     Temp 02/04/16 2021 98.4 F (36.9 C)     Temp Source 02/04/16 2021 Oral     SpO2 02/04/16 2021 100 %     Weight 02/04/16 2021 120 lb (54.432 kg)     Height 02/04/16 2021 5' (1.524 m)     Head Cir --      Peak Flow --      Pain Score 02/04/16 2022 8     Pain Loc --  Pain Edu? --      Excl. in GC? --     Constitutional: Alert and oriented. Well appearing and in no distress. Eyes: Normal exam ENT   Head: Normocephalic and atraumatic   Mouth/Throat: Mucous membranes are moist. Cardiovascular: Normal rate, regular rhythm. No murmur Respiratory: Normal respiratory effort without tachypnea nor retractions. Breath sounds are clear  Gastrointestinal: Soft, no tenderness to palpation. No rebound or guarding. No distention. Musculoskeletal: Nontender with normal range of motion in all extremities Neurologic:  Normal speech and language. No gross focal neurologic deficits Skin:  Skin is warm, dry and intact.  Psychiatric: Mood and affect are normal.  ____________________________________________   INITIAL IMPRESSION / ASSESSMENT AND PLAN / ED COURSE  Pertinent labs & imaging results that were available during my care of the patient  were reviewed by me and considered in my medical decision making (see chart for details).  Patient presents the emergency department with intermittent lower abdominal cramping, positive STD exposure. I discussed proceeding with treatment, patient is agreeable. Pelvic examination shows mild white cervical discharge, no adnexal or cervical motion tenderness to palpation. Labs are largely within normal limits, currently awaiting wet prep results.  Wet prep positive for clue cells. We'll discharge on Flagyl. ____________________________________________   FINAL CLINICAL IMPRESSION(S) / ED DIAGNOSES  STD exposure. Bacteria vaginitis  Minna AntisKevin Ajahni Nay, MD 02/04/16 2217

## 2016-02-04 NOTE — ED Notes (Signed)

## 2016-02-07 ENCOUNTER — Telehealth: Payer: Self-pay | Admitting: Emergency Medicine

## 2016-02-07 NOTE — ED Notes (Signed)
Called patient to inform of positive chlamydia (was treated int he ED)  Left message asking ehr to call me back

## 2016-05-30 ENCOUNTER — Emergency Department: Payer: Self-pay

## 2016-05-30 ENCOUNTER — Encounter: Payer: Self-pay | Admitting: Emergency Medicine

## 2016-05-30 ENCOUNTER — Emergency Department
Admission: EM | Admit: 2016-05-30 | Discharge: 2016-05-30 | Disposition: A | Discharge status: Home / Self Care | Payer: Self-pay | Attending: Emergency Medicine | Admitting: Emergency Medicine

## 2016-05-30 DIAGNOSIS — W108XXA Fall (on) (from) other stairs and steps, initial encounter: Secondary | ICD-10-CM | POA: Insufficient documentation

## 2016-05-30 DIAGNOSIS — Y9289 Other specified places as the place of occurrence of the external cause: Secondary | ICD-10-CM | POA: Insufficient documentation

## 2016-05-30 DIAGNOSIS — M545 Low back pain: Secondary | ICD-10-CM

## 2016-05-30 DIAGNOSIS — M5440 Lumbago with sciatica, unspecified side: Secondary | ICD-10-CM | POA: Insufficient documentation

## 2016-05-30 DIAGNOSIS — Y999 Unspecified external cause status: Secondary | ICD-10-CM | POA: Insufficient documentation

## 2016-05-30 DIAGNOSIS — Y939 Activity, unspecified: Secondary | ICD-10-CM | POA: Insufficient documentation

## 2016-05-30 MED ORDER — OXYCODONE-ACETAMINOPHEN 5-325 MG PO TABS: 1.0000 | ORAL_TABLET | Freq: Four times a day (QID) | ORAL | 0 refills | Status: DC | PRN

## 2016-05-30 MED ORDER — IBUPROFEN 800 MG PO TABS: 800.0000 mg | ORAL_TABLET | Freq: Three times a day (TID) | ORAL | 0 refills | Status: DC | PRN

## 2016-05-30 MED ORDER — CYCLOBENZAPRINE HCL 10 MG PO TABS: 10.0000 mg | ORAL_TABLET | Freq: Three times a day (TID) | ORAL | 0 refills | Status: DC | PRN

## 2016-05-30 MED ORDER — IBUPROFEN 800 MG PO TABS
800.0000 mg | ORAL_TABLET | Freq: Once | ORAL | Status: AC
  Administered 2016-05-30: 800 mg via ORAL
  Filled 2016-05-30: qty 1

## 2016-05-30 NOTE — ED Provider Notes (Signed)
[facility] Emergency Department Provider Note  ____________________________________________  Time seen: Approximately 8:18 PM  I have reviewed the triage vital signs and the nursing notes.   HISTORY  Chief Complaint Back Pain    HPI Amanda PeaKayameisha Goyer is a 30 y.o. female who fell going down her back deck stairs earlier today. He landed on the ground injuring her mid and lower back. Did not hit her head. Denies loss of consciousness. History of back pain but no surgery. No current abdominal pain or fevers. She has some numbness and tingling into the legs. No weakness. No fevers or chills.   History reviewed. No pertinent past medical history.  There are no active problems to display for this patient.   Past Surgical History:  Procedure Laterality Date  . CESAREAN SECTION      Current Outpatient Rx  . Order #: [id number] Class: Print  . Order #: [id number] Class: Print  . Order #: [id number] Class: Print    Allergies Pork-derived products  No family history on file.  Social History Social History  Substance Use Topics  . Smoking status: Never Smoker  . Smokeless tobacco: Not on file  . Alcohol use No    Review of Systems Constitutional: No fever/chills Cardiovascular: Denies chest pain. Respiratory: Denies shortness of breath. Gastrointestinal: No abdominal pain.  No nausea, no vomiting.  No diarrhea.  No constipation. Genitourinary: Negative for dysuria. Musculoskeletal: per HPI Skin: Negative for rash. Neurological: Negative for headaches, focal weakness or numbness. 10-point ROS otherwise negative.  ____________________________________________   PHYSICAL EXAM:  VITAL SIGNS: ED Triage Vitals  Enc Vitals Group     BP 05/30/16 1858 93/77     Pulse Rate 05/30/16 1858 93     Resp 05/30/16 1858 18     Temp 05/30/16 1858 98.5 F (36.9 C)     Temp Source 05/30/16 1858 Oral     SpO2 05/30/16 1858 100 %     Weight 05/30/16 1858 120  lb (54.4 kg)     Height 05/30/16 1858 5' (1.524 m)     Head Circumference --      Peak Flow --      Pain Score 05/30/16 1859 10     Pain Loc --      Pain Edu? --      Excl. in GC? --     Constitutional: Alert and oriented. Well appearing and in no acute distress. Eyes: Conjunctivae are normal. PERRL. EOMI. Ears:  Clear with normal landmarks. No erythema. Head: Atraumatic. Nose: No congestion/rhinnorhea. Mouth/Throat: Mucous membranes are moist.  Oropharynx non-erythematous. No lesions. Neck:  Supple.  No adenopathy.  No cervical or paracervical tenderness. Cardiovascular: Normal rate, regular rhythm. Grossly normal heart sounds.  Good peripheral circulation. Respiratory: Normal respiratory effort.  No retractions. Lungs CTAB. Gastrointestinal: Soft and nontender. No distention. No abdominal bruits. No CVA tenderness. Musculoskeletal: Nml ROM of upper and lower extremity joints.Mild proximal lumbar tenderness and paralumbar muscle tenderness. Negative straight leg raise bilaterally. Normal internal rotation of the hip joints. Neurologic:  Normal speech and language. No gross focal neurologic deficits are appreciated. No gait instability. Skin:  Skin is warm, dry and intact. No rash noted. Psychiatric: Mood and affect are normal. Speech and behavior are normal.  ____________________________________________   LABS (all labs ordered are listed, but only abnormal results are displayed)  Labs Reviewed - No data to display ____________________________________________  EKG    ____________________________________________  RADIOLOGY  CLINICAL DATA:  Lumbosacral back pain after fall. Slip off  porch today.  EXAM: LUMBAR SPINE - 2-3 VIEW  COMPARISON:  None.  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 The transitional lumbosacral segment will be labeled L5. The alignment is maintained. Vertebral body  heights are normal. There is no listhesis. The posterior elements are intact. Disc spaces are preserved. No fracture. Sacroiliac joints are symmetric and normal.  IMPRESSION: No fracture or subluxation of the lumbar spine. 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   Electronically Signed   By: Rubye Oaks M.D.   On: 05/30/2016 21:13 ____________________________________________   PROCEDURES  Procedure(s) performed: None  Critical Care performed: No  ____________________________________________   INITIAL IMPRESSION / ASSESSMENT AND PLAN / ED COURSE  Pertinent labs & imaging results that were available during my care of the patient were reviewed by me and considered in my medical decision making (see chart for details).  30 year old female who fell earlier today down stairs on her back porch landing on her back on the ground. Stable lumbar films. Symptoms consistent with a strain. Given ibuprofen, Flexeril and a few Percocet. Follow up with her physician if not improving. ____________________________________________   FINAL CLINICAL IMPRESSION(S) / ED DIAGNOSES  Final diagnoses:  Bilateral low back pain, with sciatica presence unspecified      Ignacia Bayley, PA-C 05/30/16 2122    Phineas Semen, MD 05/30/16 2359

## 2016-05-30 NOTE — ED Triage Notes (Signed)
Pt presents to ED with reports of low and mid back pain. Pt states she slipped off her deck today. Pt denies LOC. Pt denies hitting her head.

## 2016-05-30 NOTE — Discharge Instructions (Signed)
Take pain medicine as directed. Begin exercises as pain improves. Follow-up with your physician for further evaluation. Return to emergency room if any worsening symptoms.

## 2016-08-25 LAB — HM PAP SMEAR: HM Pap smear: NEGATIVE

## 2016-09-03 ENCOUNTER — Emergency Department
Admission: EM | Admit: 2016-09-03 | Discharge: 2016-09-03 | Disposition: A | Discharge status: Home / Self Care | Payer: Managed Care, Other (non HMO) | Attending: Emergency Medicine | Admitting: Emergency Medicine

## 2016-09-03 ENCOUNTER — Encounter: Payer: Self-pay | Admitting: Emergency Medicine

## 2016-09-03 DIAGNOSIS — R21 Rash and other nonspecific skin eruption: Secondary | ICD-10-CM | POA: Insufficient documentation

## 2016-09-03 DIAGNOSIS — Z791 Long term (current) use of non-steroidal anti-inflammatories (NSAID): Secondary | ICD-10-CM | POA: Insufficient documentation

## 2016-09-03 DIAGNOSIS — Z79899 Other long term (current) drug therapy: Secondary | ICD-10-CM | POA: Insufficient documentation

## 2016-09-03 HISTORY — DX: Measles without complication: B05.9

## 2016-09-03 LAB — POCT PREGNANCY, URINE: Preg Test, Ur: NEGATIVE

## 2016-09-03 MED ORDER — DIPHENHYDRAMINE HCL 25 MG PO CAPS
50.0000 mg | ORAL_CAPSULE | Freq: Once | ORAL | Status: AC
  Administered 2016-09-03: 50 mg via ORAL
  Filled 2016-09-03: qty 2

## 2016-09-03 MED ORDER — DEXAMETHASONE SODIUM PHOSPHATE 10 MG/ML IJ SOLN
10.0000 mg | Freq: Once | INTRAMUSCULAR | Status: AC
  Administered 2016-09-03: 10 mg via INTRAMUSCULAR
  Filled 2016-09-03: qty 1

## 2016-09-03 MED ORDER — PREDNISONE 10 MG PO TABS: ORAL_TABLET | ORAL | 0 refills | Status: DC

## 2016-09-03 NOTE — Discharge Instructions (Signed)
Follow-up with your primary care doctor if any continued problems. You may also need to see a dermatologist and one is listed on your discharge papers. You may continue taking Benadryl at home 1 or 2 capsules as needed for itching. Begin taking prednisone as directed starting tomorrow morning. At this time discontinue eating salmon to see if this helps with the rash.

## 2016-09-03 NOTE — ED Provider Notes (Signed)
[facility] Emergency Department Provider Note  ____________________________________________   First MD Initiated Contact with Patient 09/03/16 1256     (approximate)  I have reviewed the triage vital signs and the nursing notes.   HISTORY  Chief Complaint Rash   HPI Jean Mckay is a 30 y.o. female is here complaining of rash to her right hand and forearm along with rash to her right side of her face.Patient states that she has been taking Benadryl at home without any relief of the itching. She denies any fever or chills. She does not have any problems breathing. She denies any previous problems like this. Patient states that she ate salmon twice this week and questions whether this is the cause of her rash. She has not had any allergies to fish in the past. She also recalls that her daughter was treated several weeks ago for hand-foot-and-mouth disease. Patient denies any fever, chills, nausea or vomiting. There is noted and no headache. Patient continues to speak in full sentences without any difficulty. Currently she rates her discomfort as 9 out of 10.   Past Medical History:  Diagnosis Date  . Measles     There are no active problems to display for this patient.   Past Surgical History:  Procedure Laterality Date  . CESAREAN SECTION      Prior to Admission medications   Medication Sig Start Date End Date Taking? Authorizing Provider  cyclobenzaprine (FLEXERIL) 10 MG tablet Take 1 tablet (10 mg total) by mouth 3 (three) times daily as needed for muscle spasms. 05/30/16   Ignacia Bayleyobert Tumey, PA-C  ibuprofen (ADVIL,MOTRIN) 800 MG tablet Take 1 tablet (800 mg total) by mouth every 8 (eight) hours as needed. 05/30/16   Ignacia Bayleyobert Tumey, PA-C  oxyCODONE-acetaminophen (ROXICET) 5-325 MG tablet Take 1 tablet by mouth every 6 (six) hours as needed. 05/30/16   Ignacia Bayleyobert Tumey, PA-C  predniSONE (DELTASONE) 10 MG tablet Take 6 tablets  today, on day 2 take 5 tablets,  day 3 take 4 tablets, day 4 take 3 tablets, day 5 take  2 tablets and 1 tablet the last day 09/03/16   Tommi Rumpshonda L Landrum Carbonell, PA-C    Allergies Pork-derived products  No family history on file.  Social History Social History  Substance Use Topics  . Smoking status: Never Smoker  . Smokeless tobacco: Never Used  . Alcohol use No    Review of Systems Constitutional: No fever/chills Eyes: No visual changes. ENT: No sore throat. Cardiovascular: Denies chest pain. Respiratory: Denies shortness of breath. Gastrointestinal:  No nausea, no vomiting.  No diarrhea.   Musculoskeletal: Negative for Generalized muscle pain. Skin: Positive for rash right upper extremity and right face. Neurological: Negative for headaches, focal weakness or numbness.  10-point ROS otherwise negative.  ____________________________________________   PHYSICAL EXAM:  VITAL SIGNS: ED Triage Vitals  Enc Vitals Group     BP 09/03/16 1213 99/79     Pulse Rate 09/03/16 1211 80     Resp 09/03/16 1211 16     Temp 09/03/16 1211 98.2 F (36.8 C)     Temp Source 09/03/16 1211 Oral     SpO2 09/03/16 1211 100 %     Weight 09/03/16 1212 130 lb (59 kg)     Height 09/03/16 1212 5\' 1"  (1.549 m)     Head Circumference --      Peak Flow --      Pain Score 09/03/16 1212 9     Pain Loc --  Pain Edu? --      Excl. in GC? --     Constitutional: Alert and oriented. Well appearing and in no acute distress. Eyes: Conjunctivae are normal. PERRL. EOMI. Head: Atraumatic. Nose: No congestion/rhinnorhea. Mouth/Throat: Mucous membranes are moist.  Oropharynx non-erythematous.No edema present. Neck: No stridor.   Cardiovascular: Normal rate, regular rhythm. Grossly normal heart sounds.  Good peripheral circulation. Respiratory: Normal respiratory effort.  No retractions. Lungs CTAB. Musculoskeletal: Moves upper and lower extremities with difficulty. Normal gait was noted. Neurologic:  Normal speech and language. No gross  focal neurologic deficits are appreciated. No gait instability. Skin:  Skin is warm, dry and intact. Erythematous macular rashes noted on the dorsal aspect of the thumb extending out on the wrist and mid forearm area. There is no vesicles noted. There is no warmth on palpation. Area is nontender to touch. There is also similar erythematous area on the right side of her face without tenderness. There is no vesicles noted. No drainage. No abscess formation. Psychiatric: Mood and affect are normal. Speech and behavior are normal.  ____________________________________________   LABS (all labs ordered are listed, but only abnormal results are displayed)  Labs Reviewed  POC URINE PREG, ED  POCT PREGNANCY, URINE   PROCEDURES  Procedure(s) performed: None  Procedures  Critical Care performed: No  ____________________________________________   INITIAL IMPRESSION / ASSESSMENT AND PLAN / ED COURSE  Pertinent labs & imaging results that were available during my care of the patient were reviewed by me and considered in my medical decision making (see chart for details).    Clinical Course    Pregnancy test was negative. Patient was given Decadron 10 mg IM in the emergency room along with Benadryl 50 mg by mouth. She will continue with prednisone Dosepak as directed. She is aware that she can continue taking Benadryl at home as needed for itching. If no improvement patient is follow-up with Phineas Realharles Drew and possible referral to a dermatologist.  ____________________________________________   FINAL CLINICAL IMPRESSION(S) / ED DIAGNOSES  Final diagnoses:  Rash and nonspecific skin eruption      NEW MEDICATIONS STARTED DURING THIS VISIT:  Discharge Medication List as of 09/03/2016  2:15 PM    START taking these medications   Details  predniSONE (DELTASONE) 10 MG tablet Take 6 tablets  today, on day 2 take 5 tablets, day 3 take 4 tablets, day 4 take 3 tablets, day 5 take  2 tablets  and 1 tablet the last day, Print         Note:  This document was prepared using Dragon voice recognition software and may include unintentional dictation errors.    Tommi RumpsRhonda L Asaiah Scarber, PA-C 09/03/16 1602    Nita Sicklearolina Veronese, MD 09/04/16 2230

## 2016-09-03 NOTE — ED Triage Notes (Signed)
Pt comes into the ED via POV c/o rash present on her hands, arm, and face.  Patient states her daughter recently had hand, foot, and mouth.  Patient presents in NAD at this time with even and unlabored respirations.

## 2016-09-03 NOTE — ED Notes (Signed)
Pt states rash on her face and hands for 4 days, states her daughter recently had hand foot and mouth, pt awake and alert in no acute distress

## 2016-10-19 ENCOUNTER — Encounter: Payer: Self-pay | Admitting: Emergency Medicine

## 2016-10-19 ENCOUNTER — Emergency Department: Payer: Medicaid Other

## 2016-10-19 ENCOUNTER — Emergency Department
Admission: EM | Admit: 2016-10-19 | Discharge: 2016-10-19 | Disposition: A | Discharge status: Home / Self Care | Payer: Medicaid Other | Attending: Emergency Medicine | Admitting: Emergency Medicine

## 2016-10-19 DIAGNOSIS — O200 Threatened abortion: Secondary | ICD-10-CM | POA: Diagnosis not present

## 2016-10-19 DIAGNOSIS — O209 Hemorrhage in early pregnancy, unspecified: Secondary | ICD-10-CM | POA: Diagnosis present

## 2016-10-19 DIAGNOSIS — F17200 Nicotine dependence, unspecified, uncomplicated: Secondary | ICD-10-CM | POA: Insufficient documentation

## 2016-10-19 DIAGNOSIS — Z791 Long term (current) use of non-steroidal anti-inflammatories (NSAID): Secondary | ICD-10-CM | POA: Diagnosis not present

## 2016-10-19 DIAGNOSIS — Z3A01 Less than 8 weeks gestation of pregnancy: Secondary | ICD-10-CM | POA: Diagnosis not present

## 2016-10-19 DIAGNOSIS — O99331 Smoking (tobacco) complicating pregnancy, first trimester: Secondary | ICD-10-CM | POA: Insufficient documentation

## 2016-10-19 LAB — BASIC METABOLIC PANEL
Anion gap: 5 (ref 5–15)
BUN: 10 mg/dL (ref 6–20)
CHLORIDE: 108 mmol/L (ref 101–111)
CO2: 25 mmol/L (ref 22–32)
Calcium: 9 mg/dL (ref 8.9–10.3)
Creatinine, Ser: 0.58 mg/dL (ref 0.44–1.00)
GFR calc Af Amer: >60 mL/min (ref >60)
GFR calc non Af Amer: >60 mL/min (ref >60)
GLUCOSE: 82 mg/dL (ref 65–99)
POTASSIUM: 3.7 mmol/L (ref 3.5–5.1)
Sodium: 138 mmol/L (ref 135–145)

## 2016-10-19 LAB — CBC
HCT: 35.7 % (ref 35.0–47.0)
HEMOGLOBIN: 12.2 g/dL (ref 12.0–16.0)
MCH: 30.3 pg (ref 26.0–34.0)
MCHC: 34.1 g/dL (ref 32.0–36.0)
MCV: 88.8 fL (ref 80.0–100.0)
Platelets: 236 10*3/uL (ref 150–440)
RBC: 4.02 MIL/uL (ref 3.80–5.20)
RDW: 13.5 % (ref 11.5–14.5)
WBC: 6.5 10*3/uL (ref 3.6–11.0)

## 2016-10-19 LAB — URINALYSIS, COMPLETE (UACMP) WITH MICROSCOPIC
BACTERIA UA: NONE SEEN
BILIRUBIN URINE: NEGATIVE
GLUCOSE, UA: NEGATIVE mg/dL
KETONES UR: NEGATIVE mg/dL
NITRITE: NEGATIVE
PH: 7 (ref 5.0–8.0)
Protein, ur: NEGATIVE mg/dL
Specific Gravity, Urine: 1.02 (ref 1.005–1.030)

## 2016-10-19 LAB — ABO/RH: ABO/RH(D): B POS

## 2016-10-19 LAB — HCG, QUANTITATIVE, PREGNANCY: hCG, Beta Chain, Quant, S: 15247 m[IU]/mL — ABNORMAL HIGH (ref <5)

## 2016-10-19 MED ORDER — ACETAMINOPHEN 500 MG PO TABS
1000.0000 mg | ORAL_TABLET | Freq: Once | ORAL | Status: AC
  Administered 2016-10-19: 1000 mg via ORAL
  Filled 2016-10-19: qty 2

## 2016-10-19 NOTE — ED Triage Notes (Signed)
Pt states she is a G3L2, and found out she is approx [redacted] weeks pregnant. Pt's states she has had increased number of headaches since finding out she is pregnant, pt also c/o spotting that started yesterday and has continued, pt also c/o cramping at this time.

## 2016-10-19 NOTE — ED Provider Notes (Signed)
[facility] Emergency Department Provider Note  Time seen: 4:33 PM  I have reviewed the triage vital signs and the nursing notes.   HISTORY  Chief Complaint Vaginal Bleeding and Headache    HPI Amanda PeaKayameisha Steury is a 31 y.o. female G4 P2 A1 who presents to the emergency department approximately a weeks pregnant with vaginal spotting lower abdominal cramping. According to the patient she has a history of endometriosis him for the past several months she has been expressing lower abdominal cramping. She states her last period was in December, she took a pregnancy test 2 weeks ago at home which was positive. Patient began having spotting last night which prompted her to come to the emergency department today for evaluation.States mild lower abdominal cramping but states that is been ongoing for several months. States mild spotting which has continued today but denies any clots or tissue.  Past Medical History:  Diagnosis Date  . Measles     There are no active problems to display for this patient.   Past Surgical History:  Procedure Laterality Date  . CESAREAN SECTION      Prior to Admission medications   Medication Sig Start Date End Date Taking? Authorizing Provider  cyclobenzaprine (FLEXERIL) 10 MG tablet Take 1 tablet (10 mg total) by mouth 3 (three) times daily as needed for muscle spasms. 05/30/16   Ignacia Bayleyobert Tumey, PA-C  ibuprofen (ADVIL,MOTRIN) 800 MG tablet Take 1 tablet (800 mg total) by mouth every 8 (eight) hours as needed. 05/30/16   Ignacia Bayleyobert Tumey, PA-C  oxyCODONE-acetaminophen (ROXICET) 5-325 MG tablet Take 1 tablet by mouth every 6 (six) hours as needed. 05/30/16   Ignacia Bayleyobert Tumey, PA-C  predniSONE (DELTASONE) 10 MG tablet Take 6 tablets  today, on day 2 take 5 tablets, day 3 take 4 tablets, day 4 take 3 tablets, day 5 take  2 tablets and 1 tablet the last day 09/03/16   Tommi Rumpshonda L Summers, PA-C    Allergies  Allergen Reactions  . Pork-Derived Products  Rash    History reviewed. No pertinent family history.  Social History Social History  Substance Use Topics  . Smoking status: Current Every Day Smoker    Packs/day: 0.25  . Smokeless tobacco: Never Used  . Alcohol use Yes     Comment: Occ    Review of Systems Constitutional: Negative for fever. Cardiovascular: Negative for chest pain. Respiratory: Negative for shortness of breath. Gastrointestinal: Mild lower abdominal cramping. Genitourinary: Negative for dysuria. Positive for vaginal spotting. Neurological: Negative for headache 10-point ROS otherwise negative.  ____________________________________________   PHYSICAL EXAM:  VITAL SIGNS: ED Triage Vitals  Enc Vitals Group     BP 10/19/16 1623 114/66     Pulse Rate 10/19/16 1623 90     Resp 10/19/16 1623 18     Temp 10/19/16 1623 98.3 F (36.8 C)     Temp Source 10/19/16 1623 Oral     SpO2 10/19/16 1623 98 %     Weight 10/19/16 1622 136 lb (61.7 kg)     Height 10/19/16 1622 5\' 1"  (1.549 m)     Head Circumference --      Peak Flow --      Pain Score 10/19/16 1623 9     Pain Loc --      Pain Edu? --      Excl. in GC? --     Constitutional: Alert and oriented. Well appearing and in no distress. Eyes: Normal exam ENT   Head: Normocephalic  and atraumatic   Mouth/Throat: Mucous membranes are moist. Cardiovascular: Normal rate, regular rhythm. No murmur Respiratory: Normal respiratory effort without tachypnea nor retractions. Breath sounds are clear  Gastrointestinal: Soft, minimal suprapubic tenderness palpation, no rebound or guarding. No distention. Musculoskeletal: Nontender with normal range of motion in all extremities.  Neurologic:  Normal speech and language. No gross focal neurologic deficits Skin:  Skin is warm, dry and intact.  Psychiatric: Mood and affect are normal.  ____________________________________________   RADIOLOGY  Ultrasound consistent with 6 week 4 day  IUP  ____________________________________________   INITIAL IMPRESSION / ASSESSMENT AND PLAN / ED COURSE  Pertinent labs & imaging results that were available during my care of the patient were reviewed by me and considered in my medical decision making (see chart for details).  Overall very well-appearing patient who presents proximal May weeks pregnant with a home pregnant test is positive now with vaginal spotting since last night. Patient also states lower abdominal cramping but this is been ongoing for multiple months, patient does have a history of endometriosis to which that feels similar. Given the patient's lower abdominal cramping with vaginal spotting approximately [redacted] weeks pregnant will proceed with a ultrasound to rule out ectopic pregnancy as well as blood work. Patient agreeable plan.  Labs within normal limits. Ultrasound consistent with 6 week 4 day IUP. ____________________________________________   FINAL CLINICAL IMPRESSION(S) / ED DIAGNOSES  Threatened miscarriage    Minna Antis, MD 10/19/16 [phone]

## 2016-12-10 ENCOUNTER — Encounter: Payer: Self-pay | Admitting: Certified Nurse Midwife

## 2016-12-10 ENCOUNTER — Ambulatory Visit (INDEPENDENT_AMBULATORY_CARE_PROVIDER_SITE_OTHER): Payer: Medicaid Other | Admitting: Certified Nurse Midwife

## 2016-12-10 VITALS — BP 102/62 | HR 85 | Wt 141.0 lb

## 2016-12-10 DIAGNOSIS — Z124 Encounter for screening for malignant neoplasm of cervix: Secondary | ICD-10-CM

## 2016-12-10 DIAGNOSIS — Z113 Encounter for screening for infections with a predominantly sexual mode of transmission: Secondary | ICD-10-CM

## 2016-12-10 DIAGNOSIS — Z3A14 14 weeks gestation of pregnancy: Secondary | ICD-10-CM | POA: Diagnosis not present

## 2016-12-10 DIAGNOSIS — Z139 Encounter for screening, unspecified: Secondary | ICD-10-CM

## 2016-12-10 DIAGNOSIS — O09292 Supervision of pregnancy with other poor reproductive or obstetric history, second trimester: Secondary | ICD-10-CM

## 2016-12-10 DIAGNOSIS — O0990 Supervision of high risk pregnancy, unspecified, unspecified trimester: Secondary | ICD-10-CM

## 2016-12-10 DIAGNOSIS — O34219 Maternal care for unspecified type scar from previous cesarean delivery: Secondary | ICD-10-CM

## 2016-12-10 DIAGNOSIS — Z8632 Personal history of gestational diabetes: Secondary | ICD-10-CM

## 2016-12-10 DIAGNOSIS — O0992 Supervision of high risk pregnancy, unspecified, second trimester: Secondary | ICD-10-CM | POA: Diagnosis not present

## 2016-12-10 NOTE — Progress Notes (Signed)
New Obstetric Patient H&P    Chief Complaint: "Desires prenatal care"   History of Present Illness: Patient is a 31 y.o. [id number] Not Hispanic or Latino female, LMP 08/27/2016 presents with amenorrhea and positive home pregnancy test. Based on a 6wk4day ultrasound her EDD is 06/10/17 and her EGA is 14 weeks.  Her last pap smear was 3 years ago and was  NIL.    Since her LMP she claims she has experienced spotting in February at which time she was seen in the ER and her dating scan was done. She has had some cramping since then, but no further bleeding. Has experienced nausea and reflux as well as breast tenderness.  Complains of some knots under her arms. Denies dysuria, vulvar irritation, unusual discharge. Her past medical history is remarkable for anxiety, postpartum depression, ans suspected endometriosis. Her prior pregnancies are notable for a vacuum assisted delivery of a 6#1oz female in 2007 and a Cesarean section for FTP in 2015 delivering a 8# daughter. The last pregnancy was complicated by GDMA1 and chorioamnionitis.  Since her LMP, she admits to the use of tobacco products yes, 1-2 cigs/day Since her LMP, she admits to the use of alcohol: No Since her LMP, she admits to the use of illicit street drugs: No She claims she has gained   5 pounds since the start of her pregnancy.  There are cats in the home in the home  no  She admits close contact with children on a regular basis  yes  She has had chicken pox in the past no She has had Tuberculosis exposures, symptoms, or previously tested positive for TB   no Current or past history of domestic violence. no  Genetic Screening/Teratology Counseling: (Includes patient, baby's father, or anyone in either family with:)   1. Patient's age >/= 45 at [facility]  no 2. Thalassemia ([place], [place], Mediterranean, or Asian background): MCV<80  no 3. Neural tube defect (meningomyelocele, spina bifida, anencephaly)  no 4. Congenital heart  defect  no  5. Down syndrome  no 6. Tay-Sachs (Jewish, [place]  no 7. Canavan's Disease  no 8. Sickle cell disease or trait (African)  no  9. Hemophilia or other blood disorders  no  10. Muscular dystrophy  no  11. Cystic fibrosis  no  12. Huntington's Chorea  no  13. Mental retardation/autism  no 14. Other inherited genetic or chromosomal disorder  no 15. Maternal metabolic disorder (DM, PKU, etc)  no 16. Patient or FOB with a child with a birth defect not listed above no  16a. Patient or FOB with a birth defect themselves no 17. Recurrent pregnancy loss, or stillbirth  no  18. Any medications since LMP other than prenatal vitamins (include vitamins, supplements, OTC meds, drugs, alcohol)  no 19. Any other genetic/environmental exposure to discuss  no  Infection History:   1. Lives with someone with TB or TB exposed  no  2. Patient or partner has history of genital herpes  no 3. Rash or viral illness since LMP  no 4. History of STI (GC, CT, HPV, syphilis, HIV)  no 5. History of recent travel :  no  Other pertinent information:  FOB is [place], 51 yo, first baby for FOB     Review of Systems:10 point review of systems negative unless otherwise noted in HPI  Past Medical History:  Past Medical History:  Diagnosis Date  . Anxiety   . Gestational diabetes  controlled on diet with G2  . Measles   . Post partum depression 2007    Past Surgical History:  Past Surgical History:  Procedure Laterality Date  . CESAREAN SECTION  05/16/2014   FTP    Gynecologic History: Patient's last menstrual period was 08/27/2016 (within days).  Obstetric History: [id number]  Family History:  Family History  Problem Relation Age of Onset  . Breast cancer Maternal Grandmother 60  . AAA (abdominal aortic aneurysm) Maternal Grandmother   . Heart disease Maternal Grandfather   . Breast cancer Cousin 40  . Hypertension Mother   . Hypertension Father   . Breast cancer Cousin 40   . Heart failure Other     Social History:  Social History   Social History  . Marital status: Single    Spouse name: N/A  . Number of children: 2  . Years of education: N/A   Occupational History  . Retail sales    Social History Main Topics  . Smoking status: Current Every Day Smoker    Packs/day: 0.10    Types: Cigarettes  . Smokeless tobacco: Never Used  . Alcohol use No     Comment: not with pregnancy  . Drug use: No  . Sexual activity: Yes    Partners: Male    Birth control/ protection: None   Other Topics Concern  . Not on file   Social History Narrative  . No narrative on file    Allergies:  Allergies  Allergen Reactions  . Flagyl [Metronidazole] Rash    June 2015  . Pork-Derived Products Rash    Medications: Prior to Admission medications   Medication Sig Start Date End Date Taking? Authorizing Provider  Prenatal Vit-Fe Fumarate-FA (MULTIVITAMIN-PRENATAL) 27-0.8 MG TABS tablet Take 1 tablet by mouth daily at 12 noon.   Yes Historical Provider, MD    Physical Exam Vitals: Blood pressure 102/62, pulse 85, weight 64 kg (141 lb), last menstrual period 08/27/2016.  General: NAD HEENT: normocephalic, anicteric Thyroid: no enlargement, no palpable nodules Pulmonary: No increased work of breathing, CTAB Breast: soft, no skin or nipple changes, no masses bilaterally. No axillary, infraclavicular or supraclavicular adenopathy Cardiovascular: RRR without murmur Abdomen: soft, non-tender, non-distended.  Umbilicus without lesions.  No hepatomegaly. FH palpated a few FB above the SP.FHTs 158.  No evidence of hernia  Genitourinary:  External: Normal external female genitalia.  Normal urethral meatus, normal Bartholin's and Skene's glands.    Vagina: Normal vaginal mucosa, no evidence of prolapse.    Cervix: Grossly normal in appearance, no bleeding  Uterus: 12-14 week size, soft, regular contour  Adnexa: ovaries non-enlarged, no adnexal masses  Rectal:  deferred Extremities: no edema, erythema, or tenderness Neurologic: Grossly intact Psychiatric: mood appropriate, affect full   Assessment: 31 y.o. G3P2002 at 14 wk presenting to initiate prenatal care Prior Cesarean section for FTP GDMA1 with G2  Plan: 1) Avoid alcoholic beverages. 2) Patient encouraged not to smoke.  3) Discontinue the use of all non-medicinal drugs and chemicals.  4) Take prenatal vitamins daily.  5) Nutrition, food safety (fish, cheese advisories, and high nitrite foods) and exercise discussed.Discussed dietary changes to decrease nausea. Samples of Diclegis given to patient and given RX with instructions for use. 6) Hospital and practice style discussed with cross coverage system.  7) Genetic Screening, such as with  AFP testing, and Ultrasound, is discussed with patient. At the conclusion of today's visit patient undecided about  genetic testing. If desires,can get quad screen at visit.  Will schedule for 1 hour GTT due to hx of GDM with prior pregnancy. 8) Patient is asked about travel to areas at risk for the Zika virus, and counseled to avoid travel and exposure to mosquitoes or sexual partners who may have themselves been exposed to the virus. Testing is discussed, and will be ordered as appropriate.  9). Discussed briefly her options for delivery: repeat Cesarean section vs TOLAC. TO address after 20 weeks.  10). Schedule anatomy scan for 6 weeks. 11.) NOB labs, Pap, Aptima, and urine culture today.  Farrel Connersolleen Sutton Plake, CNM

## 2016-12-11 ENCOUNTER — Emergency Department
Admission: EM | Admit: 2016-12-11 | Discharge: 2016-12-11 | Disposition: A | Discharge status: Home / Self Care | Payer: Medicaid Other | Attending: Emergency Medicine | Admitting: Emergency Medicine

## 2016-12-11 ENCOUNTER — Emergency Department: Payer: Medicaid Other

## 2016-12-11 DIAGNOSIS — R51 Headache: Secondary | ICD-10-CM | POA: Insufficient documentation

## 2016-12-11 DIAGNOSIS — F17200 Nicotine dependence, unspecified, uncomplicated: Secondary | ICD-10-CM | POA: Diagnosis not present

## 2016-12-11 DIAGNOSIS — Y999 Unspecified external cause status: Secondary | ICD-10-CM | POA: Diagnosis not present

## 2016-12-11 DIAGNOSIS — S0083XA Contusion of other part of head, initial encounter: Secondary | ICD-10-CM | POA: Insufficient documentation

## 2016-12-11 DIAGNOSIS — S0990XA Unspecified injury of head, initial encounter: Secondary | ICD-10-CM

## 2016-12-11 DIAGNOSIS — Y929 Unspecified place or not applicable: Secondary | ICD-10-CM | POA: Diagnosis not present

## 2016-12-11 DIAGNOSIS — Y9389 Activity, other specified: Secondary | ICD-10-CM | POA: Diagnosis not present

## 2016-12-11 DIAGNOSIS — O99331 Smoking (tobacco) complicating pregnancy, first trimester: Secondary | ICD-10-CM | POA: Insufficient documentation

## 2016-12-11 DIAGNOSIS — O9A211 Injury, poisoning and certain other consequences of external causes complicating pregnancy, first trimester: Secondary | ICD-10-CM | POA: Insufficient documentation

## 2016-12-11 DIAGNOSIS — Z3A14 14 weeks gestation of pregnancy: Secondary | ICD-10-CM | POA: Insufficient documentation

## 2016-12-11 DIAGNOSIS — M791 Myalgia: Secondary | ICD-10-CM | POA: Insufficient documentation

## 2016-12-11 DIAGNOSIS — M7918 Myalgia, other site: Secondary | ICD-10-CM

## 2016-12-11 MED ORDER — ACETAMINOPHEN 325 MG PO TABS
650.0000 mg | ORAL_TABLET | Freq: Once | ORAL | Status: AC
  Administered 2016-12-11: 650 mg via ORAL
  Filled 2016-12-11: qty 2

## 2016-12-11 MED ORDER — CYCLOBENZAPRINE HCL 10 MG PO TABS: 10.0000 mg | ORAL_TABLET | Freq: Three times a day (TID) | ORAL | 0 refills | Status: DC | PRN

## 2016-12-11 MED ORDER — CYCLOBENZAPRINE HCL 10 MG PO TABS
10.0000 mg | ORAL_TABLET | Freq: Once | ORAL | Status: AC
  Administered 2016-12-11: 10 mg via ORAL
  Filled 2016-12-11: qty 1

## 2016-12-11 NOTE — ED Notes (Signed)
Pt declined having BP rechecked. Pt in NAD at this time, ambulatory without difficulty. Pt requesting worker's note to be off till Monday, EDP notified. Pt states she is able to drive home.
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
## 2016-12-11 NOTE — Discharge Instructions (Signed)
Return to the ER immediately for any abdominal pain, cramping, or vaginal bleeding.  Follow up with your primary care provider for body pain or headache that is not improving over the next few days.  Return to the ER for symptoms that change or worsen if unable to schedule an appointment.

## 2016-12-12 LAB — RPR+RH+ABO+RUB AB+AB SCR+CB...
Antibody Screen: NEGATIVE
HEMATOCRIT: 37.6 % (ref 34.0–46.6)
HEMOGLOBIN: 12.1 g/dL (ref 11.1–15.9)
HEP B S AG: NEGATIVE
HIV Screen 4th Generation wRfx: NONREACTIVE
MCH: 28.8 pg (ref 26.6–33.0)
MCHC: 32.2 g/dL (ref 31.5–35.7)
MCV: 90 fL (ref 79–97)
Platelets: 296 10*3/uL (ref 150–379)
RBC: 4.2 x10E6/uL (ref 3.77–5.28)
RDW: 13.8 % (ref 12.3–15.4)
RH TYPE: POSITIVE
RPR Ser Ql: NONREACTIVE
RUBELLA: 4.12 {index} (ref >0.99)
Varicella zoster IgG: 458 index (ref >165)
WBC: 9 10*3/uL (ref 3.4–10.8)

## 2016-12-12 LAB — URINE CULTURE: Organism ID, Bacteria: NO GROWTH

## 2016-12-12 LAB — HEMOGLOBINOPATHY EVALUATION
HEMOGLOBIN A2 QUANTITATION: 2.6 % (ref 1.8–3.2)
HGB A: 96.5 % (ref 96.4–98.8)
HGB C: 0 %
HGB S: 0 %
HGB VARIANT: 0 %
Hemoglobin F Quantitation: 0.9 % (ref 0.0–2.0)

## 2016-12-13 LAB — PAP IG, CT-NG, RFX HPV ALL
Chlamydia, Nuc. Acid Amp: POSITIVE — AB
Gonococcus by Nucleic Acid Amp: NEGATIVE
PAP Smear Comment: 0

## 2016-12-18 ENCOUNTER — Encounter: Payer: Self-pay | Admitting: Certified Nurse Midwife

## 2016-12-18 ENCOUNTER — Other Ambulatory Visit: Payer: Self-pay | Admitting: Certified Nurse Midwife

## 2016-12-18 ENCOUNTER — Telehealth: Payer: Self-pay | Admitting: Certified Nurse Midwife

## 2016-12-18 NOTE — Telephone Encounter (Signed)
Please contact pt with results.

## 2016-12-18 NOTE — Telephone Encounter (Signed)
Please FAx the RX to [facility].

## 2016-12-18 NOTE — Telephone Encounter (Signed)
Patient advised of positive Chlamydia culture. Rx for Azithromycin 1 GM for her and partner called in. Advised no sex x1 week from tx for both her and partner. Will repeat CHlamydia PCR in 1 mos. Other NOB labs WNL.

## 2016-12-18 NOTE — Telephone Encounter (Signed)
Pt is would like a call back of Labs results [phone]

## 2016-12-23 ENCOUNTER — Encounter: Payer: Self-pay | Admitting: Certified Nurse Midwife

## 2016-12-23 DIAGNOSIS — O34219 Maternal care for unspecified type scar from previous cesarean delivery: Secondary | ICD-10-CM | POA: Insufficient documentation

## 2016-12-23 DIAGNOSIS — A749 Chlamydial infection, unspecified: Secondary | ICD-10-CM | POA: Insufficient documentation

## 2016-12-23 DIAGNOSIS — O0990 Supervision of high risk pregnancy, unspecified, unspecified trimester: Secondary | ICD-10-CM | POA: Insufficient documentation

## 2016-12-23 DIAGNOSIS — O98812 Other maternal infectious and parasitic diseases complicating pregnancy, second trimester: Secondary | ICD-10-CM | POA: Insufficient documentation

## 2016-12-23 DIAGNOSIS — IMO0002 Reserved for concepts with insufficient information to code with codable children: Secondary | ICD-10-CM | POA: Insufficient documentation

## 2016-12-24 ENCOUNTER — Ambulatory Visit: Payer: Medicaid Other

## 2016-12-24 ENCOUNTER — Ambulatory Visit (INDEPENDENT_AMBULATORY_CARE_PROVIDER_SITE_OTHER): Payer: Medicaid Other | Admitting: Obstetrics and Gynecology

## 2016-12-24 VITALS — BP 110/70 | Wt 141.0 lb

## 2016-12-24 DIAGNOSIS — Z139 Encounter for screening, unspecified: Secondary | ICD-10-CM

## 2016-12-24 DIAGNOSIS — IMO0002 Reserved for concepts with insufficient information to code with codable children: Secondary | ICD-10-CM

## 2016-12-24 DIAGNOSIS — Z0489 Encounter for examination and observation for other specified reasons: Secondary | ICD-10-CM

## 2016-12-24 DIAGNOSIS — O09292 Supervision of pregnancy with other poor reproductive or obstetric history, second trimester: Secondary | ICD-10-CM

## 2016-12-24 DIAGNOSIS — Z3A16 16 weeks gestation of pregnancy: Secondary | ICD-10-CM

## 2016-12-24 DIAGNOSIS — Z8632 Personal history of gestational diabetes: Secondary | ICD-10-CM

## 2016-12-24 DIAGNOSIS — O0992 Supervision of high risk pregnancy, unspecified, second trimester: Secondary | ICD-10-CM

## 2016-12-24 DIAGNOSIS — Z362 Encounter for other antenatal screening follow-up: Secondary | ICD-10-CM

## 2016-12-24 NOTE — Progress Notes (Signed)
Pos PNVs. No VB, LOF. Pos NVP--taking diclegis with some relief. Anat scan incomplete--f/u in 4 wks. 1hr GTT today. Chlamydia tx completed, partner was treated. Pt due for TOC at next visit.

## 2016-12-24 NOTE — Progress Notes (Signed)
Review of ULTRASOUND.    I have personally reviewed images and report of recent ultrasound done at [facility].    Plan of management to be discussed with patient.  Annamarie Major, MD, [facility], [facility] 12/24/2016  10:49 AM

## 2016-12-24 NOTE — Progress Notes (Unsigned)
Review of ULTRASOUND.    I have personally reviewed images and report of recent ultrasound done at [facility].    Plan of management to be discussed with patient. Annamarie Major, MD, [facility], [facility] 12/24/2016  10:48 AM

## 2016-12-25 ENCOUNTER — Other Ambulatory Visit: Payer: Self-pay | Admitting: Certified Nurse Midwife

## 2016-12-25 ENCOUNTER — Telehealth: Payer: Self-pay

## 2016-12-25 DIAGNOSIS — O0990 Supervision of high risk pregnancy, unspecified, unspecified trimester: Secondary | ICD-10-CM

## 2016-12-25 LAB — GLUCOSE, 1 HOUR GESTATIONAL: Gestational Diabetes Screen: 111 mg/dL (ref 65–139)

## 2016-12-25 NOTE — Telephone Encounter (Signed)
Pt called.  Forgot to ask for something for mild yeast inf.  Adv. Monistat and if doesn't help to call to be seen and not to use it the night before she is seen.  Also pt aware of glucose results.

## 2016-12-31 NOTE — Telephone Encounter (Signed)
This encounter was created in error - please disregard.

## 2017-01-21 ENCOUNTER — Ambulatory Visit (INDEPENDENT_AMBULATORY_CARE_PROVIDER_SITE_OTHER): Payer: Medicaid Other

## 2017-01-21 ENCOUNTER — Ambulatory Visit (INDEPENDENT_AMBULATORY_CARE_PROVIDER_SITE_OTHER): Payer: Medicaid Other | Admitting: Obstetrics and Gynecology

## 2017-01-21 VITALS — BP 100/68 | Wt 144.0 lb

## 2017-01-21 DIAGNOSIS — Z3A16 16 weeks gestation of pregnancy: Secondary | ICD-10-CM

## 2017-01-21 DIAGNOSIS — Z113 Encounter for screening for infections with a predominantly sexual mode of transmission: Secondary | ICD-10-CM

## 2017-01-21 DIAGNOSIS — Z3A20 20 weeks gestation of pregnancy: Secondary | ICD-10-CM

## 2017-01-21 DIAGNOSIS — O0990 Supervision of high risk pregnancy, unspecified, unspecified trimester: Secondary | ICD-10-CM

## 2017-01-21 DIAGNOSIS — Z362 Encounter for other antenatal screening follow-up: Secondary | ICD-10-CM | POA: Diagnosis not present

## 2017-01-21 NOTE — Progress Notes (Signed)
Anatomy scan  

## 2017-01-22 LAB — GC/CHLAMYDIA PROBE AMP
CHLAMYDIA, DNA PROBE: NEGATIVE
NEISSERIA GONORRHOEAE BY PCR: NEGATIVE

## 2017-02-03 LAB — AFP TETRA
DIA Mom Value: 2.02
DIA Value (EIA): 404.64 pg/mL
DSR (By Age)    1 IN: 757
DSR (Second Trimester) 1 IN: 984
GESTATIONAL AGE AFP: 20 wk
MSAFP MOM: 1.27
MSAFP: 80.5 ng/mL
MSHCG MOM: 1.13
MSHCG: 27714 m[IU]/mL
Maternal Age At EDD: 29.3 yr
OSB RISK: 10000
Test Results:: NEGATIVE
UE3 MOM: 0.81
UE3 VALUE: 1.47 ng/mL
WEIGHT: 144 [lb_av]

## 2017-02-07 ENCOUNTER — Encounter: Payer: Self-pay | Admitting: Emergency Medicine

## 2017-02-07 ENCOUNTER — Emergency Department
Admission: EM | Admit: 2017-02-07 | Discharge: 2017-02-07 | Disposition: A | Discharge status: Home / Self Care | Payer: Medicaid Other | Attending: Emergency Medicine | Admitting: Emergency Medicine

## 2017-02-07 DIAGNOSIS — Z3A22 22 weeks gestation of pregnancy: Secondary | ICD-10-CM | POA: Diagnosis not present

## 2017-02-07 DIAGNOSIS — T7840XA Allergy, unspecified, initial encounter: Secondary | ICD-10-CM | POA: Insufficient documentation

## 2017-02-07 DIAGNOSIS — F17210 Nicotine dependence, cigarettes, uncomplicated: Secondary | ICD-10-CM | POA: Diagnosis not present

## 2017-02-07 DIAGNOSIS — O24410 Gestational diabetes mellitus in pregnancy, diet controlled: Secondary | ICD-10-CM | POA: Diagnosis not present

## 2017-02-07 MED ORDER — FAMOTIDINE IN NACL 20-0.9 MG/50ML-% IV SOLN
20.0000 mg | Freq: Once | INTRAVENOUS | Status: AC
  Administered 2017-02-07: 20 mg via INTRAVENOUS
  Filled 2017-02-07: qty 50

## 2017-02-07 MED ORDER — DIPHENHYDRAMINE HCL 50 MG/ML IJ SOLN
50.0000 mg | Freq: Once | INTRAMUSCULAR | Status: AC
  Administered 2017-02-07: 50 mg via INTRAVENOUS
  Filled 2017-02-07: qty 1

## 2017-02-07 NOTE — ED Notes (Signed)
Pt lying on left side, states she is feeling tired and is still itching.  Asking how much longer she is going to have to be here.
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
## 2017-02-07 NOTE — ED Notes (Signed)
2nd dose of Benadryl verified with Dr. Darnelle CatalanMalinda by this writer.

## 2017-02-07 NOTE — Discharge Instructions (Signed)
Please follow-up with your OB/GYN doctors or your regular doctor. Please return if you are worse again. Please take 2 of the over-the-counter Benadryl tablets 50 mg total 4 times a day. Please take the Pepcid once tomorrow morning. That will work with the Benadryl to help keep the allergies from recurring. If she gets short of breath from anything like this ever call 911.

## 2017-02-07 NOTE — ED Triage Notes (Addendum)
Patient to ER for c/o swelling, redness, and rash to face, arms, and torso. Patient denies any known allergy or new products. Patient is currently [redacted] weeks pregnant.

## 2017-02-07 NOTE — ED Notes (Signed)
Pt states she developed a rash on the right arm and going up to face c/o itching and irrtation. States she took a benadryl last night and during the night but only a half tab.  Pt states she is [redacted] weeks pregnant.

## 2017-02-18 ENCOUNTER — Ambulatory Visit (INDEPENDENT_AMBULATORY_CARE_PROVIDER_SITE_OTHER): Payer: Medicaid Other | Admitting: Obstetrics & Gynecology

## 2017-02-18 VITALS — BP 110/60 | Wt 149.0 lb

## 2017-02-18 DIAGNOSIS — O0990 Supervision of high risk pregnancy, unspecified, unspecified trimester: Secondary | ICD-10-CM

## 2017-02-18 DIAGNOSIS — O34219 Maternal care for unspecified type scar from previous cesarean delivery: Secondary | ICD-10-CM

## 2017-02-18 DIAGNOSIS — Z3A24 24 weeks gestation of pregnancy: Secondary | ICD-10-CM

## 2017-02-18 MED ORDER — SERTRALINE HCL 50 MG PO TABS: 50.0000 mg | ORAL_TABLET | Freq: Every day | ORAL | 2 refills | Status: DC

## 2017-02-18 NOTE — Progress Notes (Signed)
PNV, Dentist discussed, Depression discussed, therapy and Zoloft counseled (Rx), Plans breast feeding, Plans Nexplanon

## 2017-02-18 NOTE — Patient Instructions (Signed)

## 2017-03-19 ENCOUNTER — Ambulatory Visit (INDEPENDENT_AMBULATORY_CARE_PROVIDER_SITE_OTHER): Payer: Medicaid Other | Admitting: Obstetrics and Gynecology

## 2017-03-19 ENCOUNTER — Other Ambulatory Visit: Payer: Medicaid Other

## 2017-03-19 VITALS — BP 114/70 | Wt 152.0 lb

## 2017-03-19 DIAGNOSIS — Z3A28 28 weeks gestation of pregnancy: Secondary | ICD-10-CM

## 2017-03-19 DIAGNOSIS — Z3A24 24 weeks gestation of pregnancy: Secondary | ICD-10-CM

## 2017-03-19 DIAGNOSIS — O0990 Supervision of high risk pregnancy, unspecified, unspecified trimester: Secondary | ICD-10-CM

## 2017-03-19 DIAGNOSIS — O34219 Maternal care for unspecified type scar from previous cesarean delivery: Secondary | ICD-10-CM

## 2017-03-19 NOTE — Progress Notes (Signed)
No vb. No lof. 28 week labs  U/S for s<d at nv.

## 2017-03-20 LAB — 28 WEEK RH+PANEL
Basophils Absolute: 0 x10E3/uL (ref 0.0–0.2)
Basos: 0 %
EOS (ABSOLUTE): 0 x10E3/uL (ref 0.0–0.4)
Eos: 1 %
Gestational Diabetes Screen: 132 mg/dL (ref 65–139)
HIV Screen 4th Generation wRfx: NONREACTIVE
Hematocrit: 30.6 % — ABNORMAL LOW (ref 34.0–46.6)
Hemoglobin: 10 g/dL — ABNORMAL LOW (ref 11.1–15.9)
Immature Grans (Abs): 0 x10E3/uL (ref 0.0–0.1)
Immature Granulocytes: 1 %
Lymphocytes Absolute: 1.9 x10E3/uL (ref 0.7–3.1)
Lymphs: 21 %
MCH: 28.9 pg (ref 26.6–33.0)
MCHC: 32.7 g/dL (ref 31.5–35.7)
MCV: 88 fL (ref 79–97)
Monocytes Absolute: 0.4 x10E3/uL (ref 0.1–0.9)
Monocytes: 5 %
Neutrophils Absolute: 6.3 x10E3/uL (ref 1.4–7.0)
Neutrophils: 72 %
Platelets: 265 x10E3/uL (ref 150–379)
RBC: 3.46 x10E6/uL — ABNORMAL LOW (ref 3.77–5.28)
RDW: 13.4 % (ref 12.3–15.4)
RPR Ser Ql: NONREACTIVE
WBC: 8.8 x10E3/uL (ref 3.4–10.8)

## 2017-03-27 ENCOUNTER — Encounter: Payer: Self-pay | Admitting: Emergency Medicine

## 2017-03-27 ENCOUNTER — Emergency Department
Admission: EM | Admit: 2017-03-27 | Discharge: 2017-03-27 | Disposition: A | Discharge status: Home / Self Care | Payer: Medicaid Other | Attending: Emergency Medicine | Admitting: Emergency Medicine

## 2017-03-27 DIAGNOSIS — Z331 Pregnant state, incidental: Secondary | ICD-10-CM | POA: Diagnosis not present

## 2017-03-27 DIAGNOSIS — F17210 Nicotine dependence, cigarettes, uncomplicated: Secondary | ICD-10-CM | POA: Diagnosis not present

## 2017-03-27 DIAGNOSIS — Z79899 Other long term (current) drug therapy: Secondary | ICD-10-CM | POA: Insufficient documentation

## 2017-03-27 DIAGNOSIS — R21 Rash and other nonspecific skin eruption: Secondary | ICD-10-CM | POA: Insufficient documentation

## 2017-03-27 DIAGNOSIS — Z3A29 29 weeks gestation of pregnancy: Secondary | ICD-10-CM | POA: Diagnosis not present

## 2017-03-27 MED ORDER — DIPHENHYDRAMINE HCL 50 MG PO TABS: 50.0000 mg | ORAL_TABLET | Freq: Three times a day (TID) | ORAL | 0 refills | Status: DC | PRN
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
## 2017-03-27 NOTE — ED Triage Notes (Signed)
ptt o ed with c./o swelling and redness to right hand that started on Wednesday.  Pt reports recent hx of same.  Denies difficulty breathing, denies difficulty swallowing.  Pt in no acute distress at this time.

## 2017-04-02 ENCOUNTER — Other Ambulatory Visit (INDEPENDENT_AMBULATORY_CARE_PROVIDER_SITE_OTHER): Payer: Medicaid Other

## 2017-04-02 ENCOUNTER — Ambulatory Visit (INDEPENDENT_AMBULATORY_CARE_PROVIDER_SITE_OTHER): Payer: Medicaid Other | Admitting: Advanced Practice Midwife

## 2017-04-02 VITALS — BP 118/74 | Wt 153.0 lb

## 2017-04-02 DIAGNOSIS — Z362 Encounter for other antenatal screening follow-up: Secondary | ICD-10-CM

## 2017-04-02 DIAGNOSIS — Z3A30 30 weeks gestation of pregnancy: Secondary | ICD-10-CM

## 2017-04-02 DIAGNOSIS — O0990 Supervision of high risk pregnancy, unspecified, unspecified trimester: Secondary | ICD-10-CM | POA: Diagnosis not present

## 2017-04-02 DIAGNOSIS — B3731 Acute candidiasis of vulva and vagina: Secondary | ICD-10-CM

## 2017-04-02 DIAGNOSIS — B373 Candidiasis of vulva and vagina: Secondary | ICD-10-CM

## 2017-04-02 MED ORDER — FLUCONAZOLE 150 MG PO TABS: 150.0000 mg | ORAL_TABLET | Freq: Once | ORAL | 1 refills | Status: AC

## 2017-04-02 NOTE — Progress Notes (Signed)
No vb. No lof. Pt went to ER for allergic reaction Friday and Sunday.

## 2017-04-02 NOTE — Addendum Note (Signed)
Addended by: Liliane ShiSHAW, Muskan Bolla M on: 04/02/2017 02:25 PM   Modules accepted: Orders

## 2017-04-02 NOTE — Progress Notes (Signed)
Patient had allergic reaction over the weekend. She was given kenalog cream and has been taking benadryl. She thinks she also has a yeast infection now. Her symptoms are itching and discharge. Rx sent for diflucan. She is also expressing her frustration regarding not being scheduled for u/s today as she was told and for not getting "good pictures" from the other scans she has had. Patient will have growth scan today at 11:30. F/U after growth scan. Baby is 2#13oz, 29.5%, AFI is 12.6. TDAP today.

## 2017-04-16 ENCOUNTER — Ambulatory Visit (INDEPENDENT_AMBULATORY_CARE_PROVIDER_SITE_OTHER): Payer: Medicaid Other | Admitting: Advanced Practice Midwife

## 2017-04-16 VITALS — BP 120/74 | Wt 154.0 lb

## 2017-04-16 DIAGNOSIS — N760 Acute vaginitis: Secondary | ICD-10-CM

## 2017-04-16 DIAGNOSIS — B3731 Acute candidiasis of vulva and vagina: Secondary | ICD-10-CM

## 2017-04-16 DIAGNOSIS — B373 Candidiasis of vulva and vagina: Secondary | ICD-10-CM

## 2017-04-16 DIAGNOSIS — Z3A32 32 weeks gestation of pregnancy: Secondary | ICD-10-CM

## 2017-04-16 DIAGNOSIS — B9689 Other specified bacterial agents as the cause of diseases classified elsewhere: Secondary | ICD-10-CM

## 2017-04-16 MED ORDER — FLUCONAZOLE 150 MG PO TABS: 150.0000 mg | ORAL_TABLET | Freq: Once | ORAL | 0 refills | Status: AC

## 2017-04-16 MED ORDER — METRONIDAZOLE 500 MG PO TABS: 500.0000 mg | ORAL_TABLET | Freq: Two times a day (BID) | ORAL | 0 refills | Status: AC

## 2017-04-16 NOTE — Progress Notes (Signed)
Complaints of third trimester of pregnancy. She also has c/o continued vulvovaginal irritation. Clue cells seen on wet prep, Ph 4, neg whiff test, neg yeast, thick white discharge. Rx given for Metronidazole- patient denies reaction as it is stated in her chart. Also sent additional Rx for diflucan if yeast develops following flagyl. No LOF, VB.

## 2017-04-30 ENCOUNTER — Encounter: Payer: Medicaid Other | Admitting: Obstetrics and Gynecology

## 2017-05-13 ENCOUNTER — Encounter: Payer: Medicaid Other | Admitting: Maternal Newborn

## 2017-05-22 ENCOUNTER — Ambulatory Visit (INDEPENDENT_AMBULATORY_CARE_PROVIDER_SITE_OTHER): Payer: Medicaid Other | Admitting: Advanced Practice Midwife

## 2017-05-22 VITALS — BP 110/80 | Wt 159.0 lb

## 2017-05-22 DIAGNOSIS — Z3685 Encounter for antenatal screening for Streptococcus B: Secondary | ICD-10-CM

## 2017-05-22 DIAGNOSIS — Z113 Encounter for screening for infections with a predominantly sexual mode of transmission: Secondary | ICD-10-CM

## 2017-05-22 DIAGNOSIS — Z3A37 37 weeks gestation of pregnancy: Secondary | ICD-10-CM

## 2017-05-22 NOTE — Progress Notes (Signed)
Has had transportation issues so she has not been to visits since 32 weeks. She has some regular but not sustained contractions. No LOF or VB. GBS and aptima today. Cervix is visually closed. Patient thinks she is scheduled on the 20th for a c/s. Called L&D and she is not on the schedule. Added her to the book and sent request for Harriett SineNancy to schedule H&P and OR. Patient to return in 1 week for ROB.

## 2017-05-22 NOTE — Progress Notes (Signed)
C/O Late night ctxs

## 2017-05-24 LAB — STREP GP B NAA: STREP GROUP B AG: NEGATIVE

## 2017-05-24 LAB — GC/CHLAMYDIA PROBE AMP
CHLAMYDIA, DNA PROBE: NEGATIVE
Neisseria gonorrhoeae by PCR: NEGATIVE

## 2017-05-25 ENCOUNTER — Telehealth: Payer: Self-pay | Admitting: Obstetrics and Gynecology

## 2017-05-25 NOTE — Telephone Encounter (Signed)
Patient is aware of H&P on 06/02/17 @ 11:45am at [facility] w/ Dr. Jean RosenthalJackson, Pre-admit Testing to be scheduled for 06/03/17, and OR on 06/04/17.

## 2017-05-25 NOTE — Telephone Encounter (Signed)
-----   Message from Tresea MallJane Gledhill, CNM sent at 05/22/2017  3:14 PM EDT ----- Regarding: c/s  Surgery Booking Request Patient Full Name:  Jean Mckay MRN: [id number]  DOB: 06/19/1986  Surgeon: Bonney AidStaebler or Jean RosenthalJackson  Requested Surgery Date and Time: 06/04/2017 AM Primary Diagnosis AND Code: Repeat c/section Secondary Diagnosis and Code:  Surgical Procedure: Cesarean Section L&D Notification: yes Admission Status: surgery admit Length of Surgery: 60 min Special Case Needs: On Q pump H&P:  (date) Phone Interview???:  Interpreter: Language:  Medical Clearance:  Special Scheduling Instructions:

## 2017-05-29 ENCOUNTER — Encounter: Payer: Medicaid Other | Admitting: Obstetrics & Gynecology

## 2017-06-02 ENCOUNTER — Encounter: Payer: Self-pay | Admitting: Obstetrics and Gynecology

## 2017-06-02 ENCOUNTER — Ambulatory Visit (INDEPENDENT_AMBULATORY_CARE_PROVIDER_SITE_OTHER): Payer: Medicaid Other | Admitting: Obstetrics and Gynecology

## 2017-06-02 VITALS — BP 124/74 | Ht 61.0 in | Wt 161.0 lb

## 2017-06-02 DIAGNOSIS — O34219 Maternal care for unspecified type scar from previous cesarean delivery: Secondary | ICD-10-CM

## 2017-06-02 DIAGNOSIS — O0990 Supervision of high risk pregnancy, unspecified, unspecified trimester: Secondary | ICD-10-CM

## 2017-06-02 DIAGNOSIS — Z3A38 38 weeks gestation of pregnancy: Secondary | ICD-10-CM

## 2017-06-02 DIAGNOSIS — A749 Chlamydial infection, unspecified: Secondary | ICD-10-CM

## 2017-06-02 DIAGNOSIS — O98812 Other maternal infectious and parasitic diseases complicating pregnancy, second trimester: Secondary | ICD-10-CM
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
## 2017-06-03 ENCOUNTER — Encounter
Admission: RE | Admit: 2017-06-03 | Discharge: 2017-06-03 | Disposition: A | Discharge status: Home / Self Care | Payer: Medicaid Other | Source: Ambulatory Visit | Attending: Obstetrics and Gynecology | Admitting: Obstetrics and Gynecology

## 2017-06-03 HISTORY — DX: Anemia, unspecified: D64.9

## 2017-06-03 HISTORY — DX: Endometriosis, unspecified: N80.9

## 2017-06-03 LAB — RAPID HIV SCREEN (HIV 1/2 AB+AG)
HIV 1/2 Antibodies: NONREACTIVE
HIV-1 P24 Antigen - HIV24: NONREACTIVE

## 2017-06-03 LAB — TYPE AND SCREEN
ABO/RH(D): B POS
Antibody Screen: NEGATIVE
EXTEND SAMPLE REASON: UNDETERMINED

## 2017-06-03 LAB — CBC
HEMATOCRIT: 30.5 % — AB (ref 35.0–47.0)
Hemoglobin: 10.2 g/dL — ABNORMAL LOW (ref 12.0–16.0)
MCH: 27.4 pg (ref 26.0–34.0)
MCHC: 33.5 g/dL (ref 32.0–36.0)
MCV: 81.8 fL (ref 80.0–100.0)
PLATELETS: 238 10*3/uL (ref 150–440)
RBC: 3.73 MIL/uL — ABNORMAL LOW (ref 3.80–5.20)
RDW: 15 % — AB (ref 11.5–14.5)
WBC: 8.1 10*3/uL (ref 3.6–11.0)

## 2017-06-03 NOTE — Patient Instructions (Addendum)
Your procedure is scheduled on: June 04, 2017 (THURSDAY ) Report TO EMERGENCY DEPARTMENT ARRIVAL TIME 5:30 AM   Remember: Instructions that are not followed completely may result in serious medical risk, up to and including death, or upon the discretion of your surgeon and anesthesiologist your surgery may need to be rescheduled.    _x___ 1. Do not eat food after midnight the night before your procedure. You may drink clear liquids up to 2 hours before you are scheduled to arrive at the hospital for your procedure.  Do not drink clear liquids within 2 hours of your scheduled arrival to the hospital.  Clear liquids include  --Water or Apple juice without pulp  --Clear carbohydrate beverage such as ClearFast or Gatorade  --Black Coffee or Clear Tea (No milk, no creamers, do not add anything to                  the coffee or Tea Type 1 and type 2 diabetics should only drink water.  No gum chewing or hard candies.     __x__ 2. No Alcohol for 24 hours before or after surgery.   __x__3. No Smoking for 24 prior to surgery.   ____  4. Bring all medications with you on the day of surgery if instructed.    __x__ 5. Notify your doctor if there is any change in your medical condition     (cold, fever, infections).     Do not wear jewelry, make-up, hairpins, clips or nail polish.  Do not wear lotions, powders, or perfumes.   Do not shave 48 hours prior to surgery. Men may shave face and neck.  Do not bring valuables to the hospital.    [facility] is not responsible for any belongings or valuables.               Contacts, dentures or bridgework may not be worn into surgery.  Leave your suitcase in the car. After surgery it may be brought to your room.  For patients admitted to the hospital, discharge time is determined by your                       treatment team.   Patients discharged the day of surgery will not be allowed to drive home.  You will need someone to drive you home and stay  with you the night of your procedure.    Please read over the following fact sheets that you were given:   [facility] Preparing for Surgery and or MRSA Information   ___ Take anti-hypertensive listed below, cardiac, seizure, asthma,     anti-reflux and psychiatric medicines. These include:  1.   2.  3.  4.  5.  6.  ____Fleets enema or Magnesium Citrate as directed.   _x___ Use CHG Soap or sage wipes as directed on instruction sheet  ( AVOID CONTACT WITH BREAST )  ____ Use inhalers on the day of surgery and bring to hospital day of surgery  ____ Stop Metformin and Janumet 2 days prior to surgery.    ____ Take 1/2 of usual insulin dose the night before surgery and none on the morning     surgery.   _x___ Follow recommendations from Cardiologist, Pulmonologist or PCP regarding          stopping Aspirin, Coumadin, Plavix ,Eliquis, Effient, or Pradaxa, and Pletal.  X____Stop Anti-inflammatories such as Advil, Aleve, Ibuprofen, Motrin, Naproxen, Naprosyn, Goodies powders or aspirin products.  OK to take Tylenol    _x___ Stop supplements until after surgery.  But may continue Vitamin D, Vitamin B,       and multivitamin.   ____ Bring C-Pap to the hospital.

## 2017-06-04 ENCOUNTER — Encounter
Admission: RE | Disposition: A | Discharge status: Home / Self Care | Payer: Self-pay | Source: Ambulatory Visit | Attending: Obstetrics and Gynecology

## 2017-06-04 ENCOUNTER — Inpatient Hospital Stay: Payer: Medicaid Other | Admitting: Certified Registered Nurse Anesthetist

## 2017-06-04 ENCOUNTER — Inpatient Hospital Stay
Admission: RE | Admit: 2017-06-04 | Discharge: 2017-06-07 | DRG: 766 | Disposition: A | Discharge status: Home / Self Care | Payer: Medicaid Other | Source: Ambulatory Visit | Attending: Obstetrics and Gynecology | Admitting: Obstetrics and Gynecology

## 2017-06-04 DIAGNOSIS — O99334 Smoking (tobacco) complicating childbirth: Secondary | ICD-10-CM | POA: Diagnosis present

## 2017-06-04 DIAGNOSIS — F17210 Nicotine dependence, cigarettes, uncomplicated: Secondary | ICD-10-CM | POA: Diagnosis present

## 2017-06-04 DIAGNOSIS — O34211 Maternal care for low transverse scar from previous cesarean delivery: Secondary | ICD-10-CM | POA: Diagnosis present

## 2017-06-04 DIAGNOSIS — O9902 Anemia complicating childbirth: Secondary | ICD-10-CM | POA: Diagnosis present

## 2017-06-04 DIAGNOSIS — Z98891 History of uterine scar from previous surgery: Secondary | ICD-10-CM

## 2017-06-04 DIAGNOSIS — D649 Anemia, unspecified: Secondary | ICD-10-CM | POA: Diagnosis present

## 2017-06-04 DIAGNOSIS — O34219 Maternal care for unspecified type scar from previous cesarean delivery: Secondary | ICD-10-CM | POA: Diagnosis present

## 2017-06-04 DIAGNOSIS — Z3A38 38 weeks gestation of pregnancy: Secondary | ICD-10-CM | POA: Diagnosis not present

## 2017-06-04 DIAGNOSIS — O0990 Supervision of high risk pregnancy, unspecified, unspecified trimester: Secondary | ICD-10-CM

## 2017-06-04 LAB — RPR: RPR Ser Ql: NONREACTIVE

## 2017-06-04 SURGERY — Surgical Case
Anesthesia: Spinal

## 2017-06-04 MED ORDER — KETAMINE HCL 50 MG/ML IJ SOLN
INTRAMUSCULAR | Status: DC | PRN
  Administered 2017-06-04: 25 mg via INTRAVENOUS

## 2017-06-04 MED ORDER — BUPIVACAINE HCL 0.5 % IJ SOLN
INTRAMUSCULAR | Status: DC | PRN
  Administered 2017-06-04: 10 mL

## 2017-06-04 MED ORDER — BUPIVACAINE HCL (PF) 0.5 % IJ SOLN: 5.0000 mL | Freq: Once | INTRAMUSCULAR | Status: DC

## 2017-06-04 MED ORDER — COCONUT OIL OIL: 1.0000 "application " | TOPICAL_OIL | Status: DC | PRN

## 2017-06-04 MED ORDER — SOD CITRATE-CITRIC ACID 500-334 MG/5ML PO SOLN
30.0000 mL | ORAL | Status: AC
  Administered 2017-06-04: 30 mL via ORAL
  Filled 2017-06-04: qty 15

## 2017-06-04 MED ORDER — ONDANSETRON HCL 4 MG/2ML IJ SOLN
INTRAMUSCULAR | Status: DC | PRN
  Administered 2017-06-04: 4 mg via INTRAVENOUS

## 2017-06-04 MED ORDER — PHENYLEPHRINE HCL 10 MG/ML IJ SOLN
INTRAMUSCULAR | Status: DC | PRN
  Administered 2017-06-04: 100 ug via INTRAVENOUS

## 2017-06-04 MED ORDER — FENTANYL CITRATE (PF) 100 MCG/2ML IJ SOLN
INTRAMUSCULAR | Status: AC
  Filled 2017-06-04: qty 2

## 2017-06-04 MED ORDER — FERROUS SULFATE 325 (65 FE) MG PO TABS
325.0000 mg | ORAL_TABLET | Freq: Two times a day (BID) | ORAL | Status: DC
  Administered 2017-06-04 – 2017-06-07 (×6): 325 mg via ORAL
  Filled 2017-06-04 (×6): qty 1

## 2017-06-04 MED ORDER — OXYCODONE-ACETAMINOPHEN 5-325 MG PO TABS: 1.0000 | ORAL_TABLET | ORAL | Status: DC | PRN

## 2017-06-04 MED ORDER — LACTATED RINGERS IV SOLN
INTRAVENOUS | Status: DC
  Administered 2017-06-04 (×2): via INTRAVENOUS

## 2017-06-04 MED ORDER — IBUPROFEN 600 MG PO TABS
600.0000 mg | ORAL_TABLET | Freq: Four times a day (QID) | ORAL | Status: DC
  Administered 2017-06-05 – 2017-06-07 (×9): 600 mg via ORAL
  Filled 2017-06-04 (×9): qty 1

## 2017-06-04 MED ORDER — OXYTOCIN 40 UNITS IN LACTATED RINGERS INFUSION - SIMPLE MED
2.5000 [IU]/h | INTRAVENOUS | Status: AC
  Administered 2017-06-04: 2.5 [IU]/h via INTRAVENOUS

## 2017-06-04 MED ORDER — MORPHINE SULFATE (PF) 0.5 MG/ML IJ SOLN
INTRAMUSCULAR | Status: AC
  Filled 2017-06-04: qty 10

## 2017-06-04 MED ORDER — SIMETHICONE 80 MG PO CHEW
80.0000 mg | CHEWABLE_TABLET | Freq: Three times a day (TID) | ORAL | Status: DC
Start: 2017-06-04 — End: 2017-06-07
  Administered 2017-06-04 – 2017-06-07 (×8): 80 mg via ORAL
  Filled 2017-06-04 (×8): qty 1

## 2017-06-04 MED ORDER — OXYCODONE-ACETAMINOPHEN 5-325 MG PO TABS
2.0000 | ORAL_TABLET | ORAL | Status: DC | PRN
  Administered 2017-06-04 – 2017-06-07 (×15): 2 via ORAL
  Filled 2017-06-04 (×15): qty 2

## 2017-06-04 MED ORDER — FENTANYL CITRATE (PF) 100 MCG/2ML IJ SOLN
25.0000 ug | INTRAMUSCULAR | Status: DC | PRN
  Administered 2017-06-04: 25 ug via INTRAVENOUS
  Filled 2017-06-04: qty 2

## 2017-06-04 MED ORDER — PRENATAL MULTIVITAMIN CH
1.0000 | ORAL_TABLET | Freq: Every day | ORAL | Status: DC
  Administered 2017-06-04 – 2017-06-07 (×4): 1 via ORAL
  Filled 2017-06-04 (×3): qty 1

## 2017-06-04 MED ORDER — BUPIVACAINE 0.25 % ON-Q PUMP DUAL CATH 400 ML
400.0000 mL | INJECTION | Status: DC
  Filled 2017-06-04: qty 400

## 2017-06-04 MED ORDER — FENTANYL CITRATE (PF) 100 MCG/2ML IJ SOLN
INTRAMUSCULAR | Status: DC | PRN
  Administered 2017-06-04 (×2): 50 ug via INTRAVENOUS

## 2017-06-04 MED ORDER — LACTATED RINGERS IV SOLN
INTRAVENOUS | Status: DC
  Administered 2017-06-04: 07:00:00 via INTRAVENOUS

## 2017-06-04 MED ORDER — EPHEDRINE SULFATE 50 MG/ML IJ SOLN
INTRAMUSCULAR | Status: DC | PRN
  Administered 2017-06-04 (×3): 5 mg via INTRAVENOUS

## 2017-06-04 MED ORDER — BUPIVACAINE IN DEXTROSE 0.75-8.25 % IT SOLN
INTRATHECAL | Status: DC | PRN
  Administered 2017-06-04: 1.7 mL via INTRATHECAL

## 2017-06-04 MED ORDER — LACTATED RINGERS IV SOLN: Freq: Once | INTRAVENOUS | Status: DC

## 2017-06-04 MED ORDER — OXYCODONE-ACETAMINOPHEN 5-325 MG PO TABS: 2.0000 | ORAL_TABLET | ORAL | Status: DC | PRN

## 2017-06-04 MED ORDER — SODIUM CHLORIDE 0.9 % IV SOLN
INTRAVENOUS | Status: DC | PRN
  Administered 2017-06-04: 35 ug/min via INTRAVENOUS

## 2017-06-04 MED ORDER — MENTHOL 3 MG MT LOZG
1.0000 | LOZENGE | OROMUCOSAL | Status: DC | PRN
  Filled 2017-06-04: qty 9

## 2017-06-04 MED ORDER — MORPHINE SULFATE (PF) 0.5 MG/ML IJ SOLN
INTRAMUSCULAR | Status: DC | PRN
Start: 2017-06-04 — End: 2017-06-04
  Administered 2017-06-04: .1 mg via EPIDURAL
  Administered 2017-06-04: 1.5 mg via INTRAVENOUS

## 2017-06-04 MED ORDER — DIPHENHYDRAMINE HCL 25 MG PO CAPS
25.0000 mg | ORAL_CAPSULE | Freq: Four times a day (QID) | ORAL | Status: DC | PRN
  Administered 2017-06-04 – 2017-06-05 (×2): 25 mg via ORAL
  Filled 2017-06-04 (×2): qty 1

## 2017-06-04 MED ORDER — WITCH HAZEL-GLYCERIN EX PADS: 1.0000 "application " | MEDICATED_PAD | CUTANEOUS | Status: DC | PRN

## 2017-06-04 MED ORDER — MIDAZOLAM HCL 2 MG/2ML IJ SOLN
INTRAMUSCULAR | Status: DC | PRN
  Administered 2017-06-04: 2 mg via INTRAVENOUS

## 2017-06-04 MED ORDER — MORPHINE SULFATE (PF) 2 MG/ML IV SOLN
1.0000 mg | INTRAVENOUS | Status: AC | PRN
  Administered 2017-06-04: 1 mg via INTRAVENOUS
  Filled 2017-06-04: qty 1

## 2017-06-04 MED ORDER — CEFAZOLIN SODIUM-DEXTROSE 2-4 GM/100ML-% IV SOLN
2.0000 g | INTRAVENOUS | Status: AC
  Administered 2017-06-04: 2 g via INTRAVENOUS
  Filled 2017-06-04 (×2): qty 100

## 2017-06-04 MED ORDER — DIBUCAINE 1 % RE OINT: 1.0000 "application " | TOPICAL_OINTMENT | RECTAL | Status: DC | PRN

## 2017-06-04 MED ORDER — ONDANSETRON HCL 4 MG/2ML IJ SOLN: 4.0000 mg | Freq: Once | INTRAMUSCULAR | Status: DC | PRN

## 2017-06-04 MED ORDER — OXYTOCIN 40 UNITS IN LACTATED RINGERS INFUSION - SIMPLE MED
INTRAVENOUS | Status: AC
  Filled 2017-06-04: qty 1000

## 2017-06-04 MED ORDER — BUPIVACAINE HCL (PF) 0.5 % IJ SOLN
5.0000 mL | Freq: Once | INTRAMUSCULAR | Status: DC
  Filled 2017-06-04: qty 30

## 2017-06-04 MED ORDER — IBUPROFEN 600 MG PO TABS
ORAL_TABLET | ORAL | Status: AC
  Administered 2017-06-04: 600 mg
  Filled 2017-06-04: qty 1

## 2017-06-04 MED ORDER — OXYTOCIN 40 UNITS IN LACTATED RINGERS INFUSION - SIMPLE MED
INTRAVENOUS | Status: DC | PRN
  Administered 2017-06-04: 800 mL via INTRAVENOUS

## 2017-06-04 MED ORDER — MIDAZOLAM HCL 2 MG/2ML IJ SOLN
INTRAMUSCULAR | Status: AC
  Filled 2017-06-04: qty 2

## 2017-06-04 MED ORDER — SENNOSIDES-DOCUSATE SODIUM 8.6-50 MG PO TABS
2.0000 | ORAL_TABLET | ORAL | Status: DC
  Administered 2017-06-05 – 2017-06-07 (×3): 2 via ORAL
  Filled 2017-06-04 (×3): qty 2

## 2017-06-04 MED ORDER — KETAMINE HCL 50 MG/ML IJ SOLN
INTRAMUSCULAR | Status: AC
  Filled 2017-06-04: qty 10

## 2017-06-04 SURGICAL SUPPLY — 34 items
CANISTER SUCT 3000ML PPV (MISCELLANEOUS) ×3 IMPLANT
CATH KIT ON-Q SILVERSOAK 5IN (CATHETERS) ×6 IMPLANT
CLOSURE WOUND 1/2 X4 (GAUZE/BANDAGES/DRESSINGS) ×1
DERMABOND ADVANCED (GAUZE/BANDAGES/DRESSINGS) ×4
DERMABOND ADVANCED .7 DNX12 (GAUZE/BANDAGES/DRESSINGS) ×2 IMPLANT
DRSG OPSITE POSTOP 4X10 (GAUZE/BANDAGES/DRESSINGS) ×3 IMPLANT
DRSG TELFA 3X8 NADH (GAUZE/BANDAGES/DRESSINGS) ×6 IMPLANT
ELECT CAUTERY BLADE 6.4 (BLADE) ×3 IMPLANT
ELECT REM PT RETURN 9FT ADLT (ELECTROSURGICAL) ×3
ELECTRODE REM PT RTRN 9FT ADLT (ELECTROSURGICAL) ×1 IMPLANT
GAUZE SPONGE 4X4 12PLY STRL (GAUZE/BANDAGES/DRESSINGS) ×6 IMPLANT
GLOVE BIO SURGEON STRL SZ7 (GLOVE) ×15 IMPLANT
GLOVE INDICATOR 7.5 STRL GRN (GLOVE) ×15 IMPLANT
GOWN STRL REUS W/ TWL LRG LVL3 (GOWN DISPOSABLE) ×3 IMPLANT
GOWN STRL REUS W/TWL LRG LVL3 (GOWN DISPOSABLE) ×6
NS IRRIG 1000ML POUR BTL (IV SOLUTION) ×3 IMPLANT
PACK C SECTION AR (MISCELLANEOUS) ×3 IMPLANT
PAD ABD DERMACEA PRESS 5X9 (GAUZE/BANDAGES/DRESSINGS) ×3 IMPLANT
PAD OB MATERNITY 4.3X12.25 (PERSONAL CARE ITEMS) ×6 IMPLANT
PAD PREP 24X41 OB/GYN DISP (PERSONAL CARE ITEMS) ×3 IMPLANT
SPONGE LAP 18X18 5 PK (GAUZE/BANDAGES/DRESSINGS) IMPLANT
STRIP CLOSURE SKIN 1/2X4 (GAUZE/BANDAGES/DRESSINGS) ×2 IMPLANT
SUT CHROMIC GUT BROWN 0 54 (SUTURE) IMPLANT
SUT CHROMIC GUT BROWN 0 54IN (SUTURE)
SUT MNCRL 4-0 (SUTURE) ×2
SUT MNCRL 4-0 27XMFL (SUTURE) ×1
SUT PDS AB 1 TP1 96 (SUTURE) ×3 IMPLANT
SUT PLAIN GUT 0 (SUTURE) IMPLANT
SUT VIC AB 0 CTX 36 (SUTURE) ×4
SUT VIC AB 0 CTX36XBRD ANBCTRL (SUTURE) ×2 IMPLANT
SUT VIC AB 3-0 SH 27 (SUTURE) ×2
SUT VIC AB 3-0 SH 27X BRD (SUTURE) ×1 IMPLANT
SUTURE MNCRL 4-0 27XMF (SUTURE) ×1 IMPLANT
SWABSTK COMLB BENZOIN TINCTURE (MISCELLANEOUS) ×3 IMPLANT

## 2017-06-04 NOTE — Anesthesia Preprocedure Evaluation (Signed)
Anesthesia Evaluation  Patient identified by MRN, date of birth, ID band Patient awake    Reviewed: Allergy & Precautions, NPO status , Patient's Chart, lab work & pertinent test results  Airway Mallampati: II  TM Distance: >3 FB     Dental no notable dental hx.    Pulmonary former smoker,    Pulmonary exam normal        Cardiovascular negative cardio ROS Normal cardiovascular exam     Neuro/Psych PSYCHIATRIC DISORDERS Anxiety Depression    GI/Hepatic negative GI ROS, Neg liver ROS,   Endo/Other  diabetes, Gestational  Renal/GU negative Renal ROS  negative genitourinary   Musculoskeletal negative musculoskeletal ROS (+)   Abdominal Normal abdominal exam  (+)   Peds negative pediatric ROS (+)  Hematology  (+) anemia ,   Anesthesia Other Findings   Reproductive/Obstetrics (+) Pregnancy                             Anesthesia Physical Anesthesia Plan  ASA: II  Anesthesia Plan: Spinal   Post-op Pain Management:    Induction: Intravenous  PONV Risk Score and Plan:   Airway Management Planned: Nasal Cannula  Additional Equipment:   Intra-op Plan:   Post-operative Plan:   Informed Consent: I have reviewed the patients History and Physical, chart, labs and discussed the procedure including the risks, benefits and alternatives for the proposed anesthesia with the patient or authorized representative who has indicated his/her understanding and acceptance.   Dental advisory given  Plan Discussed with: CRNA and Surgeon  Anesthesia Plan Comments:         Anesthesia Quick Evaluation

## 2017-06-04 NOTE — Transfer of Care (Signed)
Immediate Anesthesia Transfer of Care Note  Patient: Jean Mckay  Procedure(s) Performed: Procedure(s): CESAREAN SECTION (N/A)  Patient Location: PACU  Anesthesia Type:Spinal  Level of Consciousness: awake, alert  and oriented  Airway & Oxygen Therapy: Patient Spontanous Breathing  Post-op Assessment: Report given to RN and Post -op Vital signs reviewed and stable  Post vital signs: Reviewed and stable  Last Vitals:  Vitals:   06/04/17 0600  BP: 125/75  Pulse: 80  Resp: 16  Temp: 36.7 C    Last Pain:  Vitals:   06/04/17 0600  TempSrc: Oral  PainSc: 0-No pain         Complications: No apparent anesthesia complications

## 2017-06-04 NOTE — Progress Notes (Signed)
291.37mL of QBL measured at delivery. 800 EBL entered as EBL per provider by anesthesia.

## 2017-06-04 NOTE — Anesthesia Procedure Notes (Signed)
Spinal  Patient location during procedure: OR Start time: 06/04/2017 8:00 AM End time: 06/04/2017 8:06 AM Staffing Anesthesiologist: Alvin Critchley Resident/CRNA: Demetrius Charity Performed: resident/CRNA  Preanesthetic Checklist Completed: patient identified, site marked, surgical consent, pre-op evaluation, timeout performed, IV checked, risks and benefits discussed and monitors and equipment checked Spinal Block Patient position: sitting Prep: Betadine Patient monitoring: heart rate, continuous pulse ox, blood pressure and cardiac monitor Approach: midline Location: L3-4 Injection technique: single-shot Needle Needle type: Whitacre and Introducer  Needle gauge: 25 G Needle length: 9 cm Assessment Sensory level: T4 Additional Notes Negative paresthesia. Negative blood return. Positive free-flowing CSF. Expiration date of kit checked and confirmed. Patient tolerated procedure well, without complications.

## 2017-06-04 NOTE — Discharge Summary (Signed)
OB Discharge Summary     Patient Name: Jean Mckay DOB: [birth year] MRN: [id number]  Date of admission: 06/04/2017 Delivering MD: Thomasene Mohair, MD  Date of Delivery: 06/04/2017  Date of discharge: 06/07/2017  Admitting diagnosis: REPEAT CESAREAN Intrauterine pregnancy: [redacted]w[redacted]d     Secondary diagnosis: Anemia     Discharge diagnosis: Term Pregnancy Delivered and Anemia                                                                                                Post partum procedures:none  Augmentation: n/a  Complications: None  Hospital course:  Sceduled C/S   31 y.o. yo G3P3003 at [redacted]w[redacted]d was admitted to the hospital 06/04/2017 for scheduled cesarean section with the following indication:Elective Repeat.  Membrane Rupture Time/Date:   ,    Patient delivered a Viable infant.06/04/2017  Details of operation can be found in separate operative note.  Pateint had an uncomplicated postpartum course.  She is ambulating, tolerating a regular diet, passing flatus, and urinating well. Patient is discharged home in stable condition on  06/07/17         Physical exam  Vitals:   06/06/17 1954 06/06/17 2355 06/07/17 0350 06/07/17 0802  BP: 130/78   (!) 129/58  Pulse: 68   (!) 58  Resp: 20   18  Temp: 98.2 F (36.8 C) 98.2 F (36.8 C) 98.1 F (36.7 C) 98.9 F (37.2 C)  TempSrc: Oral Oral Oral Oral  SpO2: 100%   100%  Weight:      Height:       General: alert, cooperative and no distress Lochia: appropriate Uterine Fundus: firm Incision: Healing well with no significant drainage, No significant erythema, Dressing is clean, dry, and intact DVT Evaluation: No evidence of DVT seen on physical exam. No cords or calf tenderness. No significant calf/ankle edema.  Labs: Lab Results  Component Value Date   WBC 12.1 (H) 06/05/2017   HGB 9.0 (L) 06/05/2017   HCT 27.3 (L) 06/05/2017   MCV 83.4 06/05/2017   PLT 197 06/05/2017    Discharge instruction: per After Visit  Summary.  Medications:  Allergies as of 06/07/2017      Reactions   Flagyl [metronidazole] Rash   June 2015   Pork-derived Products Diarrhea, Rash      Medication List    STOP taking these medications   diphenhydrAMINE 50 MG tablet Commonly known as:  BENADRYL   sertraline 50 MG tablet Commonly known as:  ZOLOFT     TAKE these medications   ibuprofen 600 MG tablet Commonly known as:  ADVIL,MOTRIN Take 1 tablet (600 mg total) by mouth every 6 (six) hours.   oxyCODONE-acetaminophen 5-325 MG tablet Commonly known as:  PERCOCET/ROXICET Take 2 tablets by mouth every 4 (four) hours as needed for severe pain (pain score >7/10).            Discharge Care Instructions        Start     Ordered   06/07/17 0000  ibuprofen (ADVIL,MOTRIN) 600 MG tablet  Every 6 hours     06/07/17 0918  06/07/17 0000  oxyCODONE-acetaminophen (PERCOCET/ROXICET) 5-325 MG tablet  Every 4 hours PRN     06/07/17 0918   06/07/17 0000  Call MD for:  temperature >100.4     06/07/17 0918   06/07/17 0000  Call MD for:  persistant nausea and vomiting     06/07/17 0918   06/07/17 0000  Call MD for:  severe uncontrolled pain     06/07/17 0918   06/07/17 0000  Call MD for:  redness, tenderness, or signs of infection (pain, swelling, redness, odor or green/yellow discharge around incision site)     06/07/17 0918   06/07/17 0000  Call MD for:  difficulty breathing, headache or visual disturbances     06/07/17 0918   06/07/17 0000  Call MD for:  persistant dizziness or light-headedness     06/07/17 0918   06/07/17 0000  Activity as tolerated     06/07/17 0918   06/07/17 0000  Lifting restrictions    Comments:  Weight restriction of 20 lbs.   06/07/17 0918   06/07/17 0000  Driving restriction    Comments:  Avoid driving for at least 2 weeks. No driving while taking narcotic pain medication.   06/07/17 0918   06/07/17 0000  Sexual acrtivity    Comments:  Pelvic rest for 6 weeks   06/07/17 4540    06/07/17 0000  Discharge wound care:    Comments:  Perform wound care instructions   06/07/17 0918   06/07/17 0000  Diet general     06/07/17 0918      Diet: routine diet  Activity: Advance as tolerated. Pelvic rest for 6 weeks.   Outpatient follow up: Follow-up Information    Conard Novak, MD Follow up in 1 week(s).   Specialty:  Obstetrics and Gynecology Why:  post op incision check.  Keep previously scheduled appointment, if already scheduled Contact information: [address] [place] [address] [phone]             Postpartum contraception: nexplanon versus BTL. To be discussed at 1 week follow up Rhogam Given postpartum: no Rubella vaccine given postpartum: no Varicella vaccine given postpartum: no TDaP given antepartum or postpartum: yes, 7/19  Newborn Data: Live born female  Birth Weight: 5 lb 13.8 oz (2660 g) APGAR: 8, 9   Baby Feeding: formula   Disposition:home with mother  SIGNED: Thomasene Mohair, MD 06/07/2017 9:19 AM

## 2017-06-04 NOTE — Lactation Note (Signed)
This note was copied from a baby's chart. Lactation Consultation Note  Patient Name: Jean Mckay [id number] Date: 06/04/2017     Maternal Data    Feeding Feeding Type: Bottle Fed - Formula Nipple Type: Slow - flow  Motehr has been offered assistance with breasts feeding and has declined.  Consult Status      Trudee Grip 06/04/2017, 4:54 PM

## 2017-06-04 NOTE — Anesthesia Post-op Follow-up Note (Signed)
Anesthesia QCDR form completed.        
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
## 2017-06-04 NOTE — Interval H&P Note (Signed)
History and Physical Interval Note:  06/04/2017 7:50 AM  Jean Mckay  has presented today for surgery, with the diagnosis of REPEAT CESAREAN  The various methods of treatment have been discussed with the patient and family. After consideration of risks, benefits and other options for treatment, the patient has consented to  Procedure(s): CESAREAN SECTION (N/A) as a surgical intervention .  The patient's history has been reviewed, patient examined, no change in status, stable for surgery.  I have reviewed the patient's chart and labs.  Questions were answered to the patient's satisfaction.     Thomasene Mohair, MD 06/04/2017 7:50 AM

## 2017-06-04 NOTE — OB Triage Note (Signed)
Patient came in for schedule caesarean section.

## 2017-06-04 NOTE — Op Note (Signed)
Cesarean Section Operative Note    Jean Mckay   06/04/2017   Pre-operative Diagnosis:  1) intrauterine pregnancy at [redacted]w[redacted]d  2) history of cesarean delivery, desires repeat  Post-operative Diagnosis:  1) intrauterine pregnancy at [redacted]w[redacted]d  2) history of cesarean delivery, desires repeat   Procedure: Repeat Low Transverse Cesarean Section via Pfannenstiel incision with double layer uterine closure  Surgeon: Surgeon(s) and Role:    Conard Novak, MD - Primary    Vena Austria, MD - Assisting   Anesthesia: spinal   Findings:  1) normal appearing gravid uterus, fallopian tubes, and ovaries 2) viable female infant with weight of 2,660 grams and APGARs 8 and 9   Estimated Blood Loss: 800 mL  Total IV Fluids: 1,400 ml   Urine Output: 400 mL clear urine  Specimens: none  Complications: no complications  Disposition: PACU - hemodynamically stable.   Maternal Condition: stable   Baby condition / location:  Couplet care / Skin to Skin  Procedure Details:  The patient was seen in the Holding Room. The risks, benefits, complications, treatment options, and expected outcomes were discussed with the patient. The patient concurred with the proposed plan, giving informed consent. identified as Jean Mckay and the procedure verified as C-Section Delivery. A Time Out was held and the above information confirmed.   After induction of anesthesia, the patient was draped and prepped in the usual sterile manner. A Pfannenstiel incision was made and carried down through the subcutaneous tissue to the fascia. Fascial incision was made and extended transversely. The fascia was separated from the underlying rectus tissue superiorly and inferiorly. The peritoneum was identified and entered. Peritoneal incision was extended longitudinally. The bladder flap was not sharply freed from the lower uterine segment. A low transverse uterine incision was made and the  hysterotomy was extended with cranial-caudal tension. Delivered from cephalic presentation was a 2,660 gram Living newborn infant(s) or Female with Apgar scores of 8 at one minute and 9 at five minutes. Cord ph was not sent the umbilical cord was clamped and cut cord blood was not obtained for evaluation. The placenta was removed Intact and appeared normal. The uterine outline, tubes and ovaries appeared normal. The uterine incision was closed with running locked sutures of 0 Vicryl.  A second layer of the same suture was thrown in an imbricating fashion.  Hemostasis was assured.  The uterus was returned to the abdomen and the paracolic gutters were cleared of all clots and debris.  The rectus muscles were inspected and found to be hemostatic.  The On-Q catheter pumps were inserted in accordance with the manufacturer's recommendations.  The catheters were inserted approximately 4cm cephelad to the incision line, approximately 1cm apart, straddling the midline.  They were inserted to a depth of the 4th mark. They were positioned superficial to the rectus abdominus muscles and deep to the rectus fascia.  Of note, after skin closure and during the bolus, there was a high level of resistance to injection of the bolus. Therefore, the catheter was removed to ensure it could be removed.  It was removed without difficulty.    The fascia was then reapproximated with running sutures of 1-0 PDS, looped. The subcutaneous tissue was re-approximated with 3 interrupted 3-0 vicryl vertical stitches to reduce tension on the skin closure. The subcuticular closure was performed using 4-0 monocryl. The skin closure was reinforced using surgical skin glue.   The On-Q catheters were bolused with 5 mL of 0.5% marcaine  plain for a total of 10 mL.  After removal of  The first catheter, the remaining catheter was affixed to the skin with surgical skin glue, steri-strips, and tegaderm.    Instrument, sponge, and needle counts were  correct prior the abdominal closure and were correct at the conclusion of the case.  The patient received Ancef 2 gram IV prior to skin incision (within 30 minutes). For VTE prophylaxis she was wearing SCDs throughout the case.   Signed: Conard Novak, MD 06/04/2017 9:24 AM

## 2017-06-05 ENCOUNTER — Encounter: Payer: Self-pay | Admitting: Obstetrics and Gynecology

## 2017-06-05 LAB — CBC
HCT: 27.3 % — ABNORMAL LOW (ref 35.0–47.0)
HEMOGLOBIN: 9 g/dL — AB (ref 12.0–16.0)
MCH: 27.5 pg (ref 26.0–34.0)
MCHC: 33 g/dL (ref 32.0–36.0)
MCV: 83.4 fL (ref 80.0–100.0)
Platelets: 197 10*3/uL (ref 150–440)
RBC: 3.28 MIL/uL — AB (ref 3.80–5.20)
RDW: 15.1 % — ABNORMAL HIGH (ref 11.5–14.5)
WBC: 12.1 10*3/uL — ABNORMAL HIGH (ref 3.6–11.0)

## 2017-06-05 NOTE — Anesthesia Postprocedure Evaluation (Signed)
Anesthesia Post Note  [name]  Procedure(s) Performed: Procedure(s) (LRB): CESAREAN SECTION (N/A)  Patient location during evaluation: Mother Baby Anesthesia Type: Spinal Level of consciousness: awake and alert and oriented Pain management: pain level not controlled Vital Signs Assessment: post-procedure vital signs reviewed and stable Respiratory status: spontaneous breathing Cardiovascular status: stable Postop Assessment: adequate PO intake and no apparent nausea or vomiting Anesthetic complications: no     Last Vitals:  Vitals:   06/05/17 0021 06/05/17 0415  BP: 122/68 (!) 110/46  Pulse: 72 98  Resp: 17 17  Temp: 37.2 C 37 C  SpO2: 99% 100%    Last Pain:  Vitals:   06/05/17 0432  TempSrc:   PainSc: 7                  Zachary George

## 2017-06-05 NOTE — Progress Notes (Signed)
Admit Date: 06/04/2017 Today's Date: 06/05/2017  Subjective: Postpartum Day 1: Cesarean Delivery Patient reports incisional pain and tolerating PO.    Objective: Vital signs in last 24 hours: Temp:  [97.4 F (36.3 C)-98.9 F (37.2 C)] 98.6 F (37 C) (09/21 0415) Pulse Rate:  [52-146] 98 (09/21 0415) Resp:  [10-20] 17 (09/21 0415) BP: (104-141)/(46-79) 110/46 (09/21 0415) SpO2:  [97 %-100 %] 100 % (09/21 0415)  Physical Exam:  General: alert, cooperative and no distress Lochia: appropriate Uterine Fundus: firm Incision: healing well DVT Evaluation: No evidence of DVT seen on physical exam.   Recent Labs  06/03/17 1414 06/05/17 0637  HGB 10.2* 9.0*  HCT 30.5* 27.3*    Assessment/Plan: Status post Cesarean section. Doing well postoperatively.  Continue current care. Breast and bottle feeding  Letitia Libra 06/05/2017, 7:23 AM

## 2017-06-06 NOTE — Progress Notes (Signed)
Patient ID: Colton Engdahl, female   DOB: June 13, 1986, 31 y.o.   MRN: [id number]  Obstetric Postpartum/PostOperative Daily Progress Note Subjective:  31 y.o. G3P3003 POD#2 status post repeat cesarean delivery.  She is ambulating, is tolerating po, is voiding spontaneously.  Her pain is well controlled on PO pain medications. Her lochia is less than menses.   Medications SCHEDULED MEDICATIONS  . ferrous sulfate  325 mg Oral BID WC  . ibuprofen  600 mg Oral Q6H  . prenatal multivitamin  1 tablet Oral Q1200  . senna-docusate  2 tablet Oral Q24H  . simethicone  80 mg Oral TID PC    MEDICATION INFUSIONS  . lactated ringers Stopped (06/05/17 0800)    PRN MEDICATIONS  coconut oil, witch hazel-glycerin **AND** dibucaine, diphenhydrAMINE, fentaNYL (SUBLIMAZE) injection, menthol-cetylpyridinium, ondansetron (ZOFRAN) IV, oxyCODONE-acetaminophen **OR** oxyCODONE-acetaminophen    Objective:   Vitals:   06/05/17 1942 06/05/17 2315 06/06/17 0430 06/06/17 0832  BP: 114/77   112/64  Pulse: 65   70  Resp: 20   17  Temp: 97.8 F (36.6 C) 98.5 F (36.9 C) 98.2 F (36.8 C) 98.1 F (36.7 C)  TempSrc: Oral Oral Oral Oral  SpO2: 99%   100%  Weight:      Height:        Current Vital Signs 24h Vital Sign Ranges  T 98.1 F (36.7 C) Temp  Avg: 98.3 F (36.8 C)  Min: 97.8 F (36.6 C)  Max: 98.7 F (37.1 C)  BP 112/64 BP  Min: 107/64  Max: 114/77  HR 70 Pulse  Avg: 78.8  Min: 65  Max: 93  RR 17 Resp  Avg: 18.6  Min: 17  Max: 20  SaO2 100 % Not Delivered SpO2  Avg: 99 %  Min: 98 %  Max: 100 %       24 Hour I/O Current Shift I/O  Time Ins Outs 09/21 0701 - 09/22 0700 In: 120 [P.O.:120] Out: 400 [Urine:400] No intake/output data recorded.  General: NAD Pulmonary: no increased work of breathing Abdomen: non-distended, non-tender, fundus firm at level of umbilicus Inc: Clean/dry/intact Extremities: no edema, no erythema, no tenderness  Labs:   Recent Labs Lab 06/03/17 1414  06/05/17 0637  WBC 8.1 12.1*  HGB 10.2* 9.0*  HCT 30.5* 27.3*  PLT 238 197    Assessment:   31 y.o. G3P3003 postoperative day # 2, s/p repeat cesarean section  Plan:  1) Acute blood loss anemia - hemodynamically stable and asymptomatic - po ferrous sulfate  2) B POS / Rubella 4.12 (03/28 1059)/ Varicella Immune  3) TDAP status up to date (received 7/19)  4) formula feeding /Contraception = Nexplanon vs BTL (papers signed this week and will consider after 30 days)  5) Disposition: home tomorrow  Conard Novak, MD, FACOG 06/06/2017 11:09 AM
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
## 2017-06-07 MED ORDER — OXYCODONE-ACETAMINOPHEN 5-325 MG PO TABS: 2.0000 | ORAL_TABLET | ORAL | 0 refills | Status: DC | PRN

## 2017-06-07 MED ORDER — IBUPROFEN 600 MG PO TABS: 600.0000 mg | ORAL_TABLET | Freq: Four times a day (QID) | ORAL | 0 refills | Status: DC

## 2017-06-07 NOTE — Progress Notes (Signed)
Patient discharge to home via wheelchair with spouse and baby in car seat.  

## 2017-06-09 ENCOUNTER — Telehealth: Payer: Self-pay

## 2017-06-09 NOTE — Telephone Encounter (Signed)
Pt called asking if she was supposed to take the pump out herself. I advised her this is fine. She stated all looks well from site

## 2017-06-10 ENCOUNTER — Telehealth: Payer: Self-pay | Admitting: Obstetrics and Gynecology

## 2017-06-10 ENCOUNTER — Ambulatory Visit (INDEPENDENT_AMBULATORY_CARE_PROVIDER_SITE_OTHER): Payer: Medicaid Other | Admitting: Obstetrics and Gynecology

## 2017-06-10 ENCOUNTER — Encounter: Payer: Self-pay | Admitting: Obstetrics and Gynecology

## 2017-06-10 VITALS — BP 110/72 | Ht 61.0 in | Wt 150.0 lb

## 2017-06-10 DIAGNOSIS — Z98891 History of uterine scar from previous surgery: Secondary | ICD-10-CM

## 2017-06-10 NOTE — Progress Notes (Signed)
   Postoperative Follow-up Patient presents post op from cesarean section  6days ago.  Subjective: She denies fever, chills, nausea and vomiting. Eating a regular diet without difficulty. The patient is not having any pain.  Activity: slowly increasing (appropriate). She denies issues with her incision.    Objective: BP 110/72   Ht  (1.549 m)   Wt 150 lb (68 kg)   BMI 28.34 kg/m   Constitutional: Well nourished, well developed female in no acute distress.  HEENT: normal Skin: Warm and dry.  Abdomen: soft, NT, ND, +BS, uterus at U-4 clean, dry, intact and no erythema, induration, warmth, and tenderness Extremity: no edema   Assessment: 31 y.o. s/p cesarean section progressing well  Plan: Patient has done well after surgery with no apparent complications.  I have discussed the post-operative course to date, and the expected progress moving forward.  The patient understands what complications to be concerned about.    Activity plan: increase slowly Wound management instructions given  Return in about 5 weeks (around 07/15/2017) for 6 wk postpartum and Nexplanon placement.  Thomasene Mohair, MD 06/10/2017 4:12 PM

## 2017-06-10 NOTE — Telephone Encounter (Signed)
Patient scheduled 10/29 for nexplanon insert with SDJ.

## 2017-06-12 ENCOUNTER — Other Ambulatory Visit: Payer: Self-pay | Admitting: Obstetrics and Gynecology

## 2017-06-12 ENCOUNTER — Telehealth: Payer: Self-pay

## 2017-06-12 DIAGNOSIS — O0990 Supervision of high risk pregnancy, unspecified, unspecified trimester: Secondary | ICD-10-CM

## 2017-06-12 NOTE — Telephone Encounter (Signed)
Pt wants refill on pain medication. Per SDJ she needs to be seen if she is still having this much pain. Spoke with pt and she would like to schedule an appt, I advised her that sara would call her to get this scheduled for incision check. Please schedule for pt.

## 2017-06-15 NOTE — Telephone Encounter (Signed)
Attempted to reach patient to schedule appointment . Phone rang and disconnected.

## 2017-06-15 NOTE — Telephone Encounter (Signed)
Called and left voice mail for patient to call back for incision check

## 2017-06-16 NOTE — Telephone Encounter (Signed)
Called and left voice mail for patient to call back to be schedule °

## 2017-06-19 NOTE — Telephone Encounter (Signed)
Noted. Will order to arrive by apt date/time. 

## 2017-07-13 ENCOUNTER — Ambulatory Visit (INDEPENDENT_AMBULATORY_CARE_PROVIDER_SITE_OTHER): Payer: Medicaid Other | Admitting: Obstetrics and Gynecology

## 2017-07-13 DIAGNOSIS — Z3049 Encounter for surveillance of other contraceptives: Secondary | ICD-10-CM

## 2017-07-13 DIAGNOSIS — Z30017 Encounter for initial prescription of implantable subdermal contraceptive: Secondary | ICD-10-CM

## 2017-07-13 MED ORDER — ETONOGESTREL 68 MG ~~LOC~~ IMPL: 68.0000 mg | DRUG_IMPLANT | Freq: Once | SUBCUTANEOUS | Status: DC

## 2017-07-13 NOTE — Progress Notes (Signed)
Postpartum Visit   Chief Complaint  Patient presents with  . 6 week post partum visit    History of Present Illness: Patient is a 31 y.o. [id number] presents for postpartum visit.  Date of delivery: 9/20/201 Type of delivery: C-Section Episiotomy No.  Laceration: no Pregnancy or labor problems:  no Any problems since the delivery:  no  Newborn Details:  SINGLETON :  1. Baby's name: Jasir. Birth weight: 5.14 Maternal Details:  Breast Feeding:  no Post partum depression/anxiety noted: no Edinburgh Post-Partum Depression Score:  0  Date of last PAP: 12/10/2016  Past Medical History:  Diagnosis Date  . Anemia    Previous pregnancy  . Anxiety   . Endometriosis 2012  . Gestational diabetes    controlled on diet with G2  . Measles   . Post partum depression 2007    Past Surgical History:  Procedure Laterality Date  . CESAREAN SECTION  05/16/2014   FTP  . CESAREAN SECTION N/A 06/04/2017   Procedure: CESAREAN SECTION;  Surgeon: Conard NovakJackson, Quamesha Mullet D, MD;  Location: [facility] ORS;  Service: Obstetrics;  Laterality: N/A;  . WISDOM TOOTH EXTRACTION      Medications: denies    Allergies  Allergen Reactions  . Flagyl [Metronidazole] Rash    June 2015  . Pork-Derived Products Diarrhea and Rash     Social History   Social History  . Marital status: Single    Spouse name: N/A  . Number of children: 2  . Years of education: N/A   Occupational History  . Retail sales    Social History Main Topics  . Smoking status: Former Smoker    Packs/day: 0.10    Types: Cigarettes    Quit date: 12/14/2016  . Smokeless tobacco: Never Used  . Alcohol use No     Comment: not with pregnancy  . Drug use: No  . Sexual activity: Yes    Partners: Male    Birth control/ protection: None   Other Topics Concern  . Not on file   Social History Narrative  . No narrative on file    Family History  Problem Relation Age of Onset  . Breast cancer Maternal Grandmother 60  . AAA (abdominal  aortic aneurysm) Maternal Grandmother   . Heart disease Maternal Grandfather   . Breast cancer Cousin 40  . Hypertension Mother   . Hypertension Father   . Breast cancer Cousin 40  . Heart failure Other     Review of Systems  Constitutional: Negative.   HENT: Negative.   Eyes: Negative.   Respiratory: Negative.   Cardiovascular: Negative.   Gastrointestinal: Negative.   Genitourinary: Negative.   Musculoskeletal: Negative.   Skin: Negative.   Neurological: Negative.   Psychiatric/Behavioral: Negative.      Physical Exam BP 118/74   Ht 5' (1.524 m)   Wt 148 lb (67.1 kg)   LMP 07/12/2017   BMI 28.90 kg/m   Physical Exam  Constitutional: She is oriented to person, place, and time. She appears well-developed and well-nourished. No distress.  Genitourinary: Uterus normal. Pelvic exam was performed with patient supine. There is no rash, tenderness or lesion on the right labia. There is no rash, tenderness or lesion on the left labia. Vagina exhibits no lesion. There is bleeding (scant old blood in vaginal vault) in the vagina. No erythema or tenderness in the vagina. Right adnexum does not display mass, does not display tenderness and does not display fullness. Left adnexum does not display mass, does  not display tenderness and does not display fullness. Cervix does not exhibit motion tenderness or lesion.   Uterus is mobile and anteverted. Uterus is not enlarged, tender, exhibiting a mass or irregular (is regular).  Eyes: EOM are normal. No scleral icterus.  Neck: Normal range of motion. Neck supple.  Cardiovascular: Normal rate and regular rhythm.   Pulmonary/Chest: Effort normal and breath sounds normal. No respiratory distress. She has no wheezes. She has no rales.  Abdominal: Soft. Bowel sounds are normal. She exhibits no distension and no mass. There is no tenderness. There is no rebound and no guarding.  Incision: clean, dry, intact without erythema, induration, warmth, and  tenderness  Musculoskeletal: Normal range of motion. She exhibits no edema.  Neurological: She is alert and oriented to person, place, and time. No cranial nerve deficit.  Skin: Skin is warm and dry. No erythema.  Psychiatric: She has a normal mood and affect. Her behavior is normal. Judgment normal.     Female Chaperone present during breast and/or pelvic exam.  GYNECOLOGY PROCEDURE NOTE  Patient is a 31 y.o. [id number] presenting for Nexplanon insertion as her desires means of contraception.  She provided informed consent, signed copy in the chart, time out was performed. Pregnancy test was negative, with self reported LMP of Patient's last menstrual period was 07/12/2017.  She understands that Nexplanon is a progesterone only therapy, and that patients often patients have irregular and unpredictable vaginal bleeding or amenorrhea. She understands that other side effects are possible related to systemic progesterone, including but not limited to, headaches, breast tenderness, nausea, and irritability. While effective at preventing pregnancy long acting reversible contraceptives do not prevent transmission of sexually transmitted diseases and use of barrier methods for this purpose was discussed. The placement procedure for Nexplanon was reviewed with the patient in detail including risks of nerve injury, infection, bleeding and injury to other muscles or tendons. She understands that the Nexplanon implant is good for 3 years and needs to be removed at the end of that time.  She understands that Nexplanon is an extremely effective option for contraception, with failure rate of <1%. This information is reviewed today and all questions were answered. Informed consent was obtained, both verbally and written.   The patient is healthy and has no contraindications to Implanon use. Urine pregnancy test was performed today and was negative.  Procedure Appropriate time out taken.  Patient placed in dorsal  supine with left arm above head, elbow flexed at 90 degrees, arm resting on examination table.  The bicipital grove was palpated and site 8-10cm proximal to the medial epicondyle was indentified . The insertion site was prepped with a two betadine swabs and then injected with 3 cc of 1% lidocaine without epinephrine.  Nexplanon removed form sterile blister packaging,  Device confirmed in needle, before inserting full length of needle, tenting up the skin as the needle was advance.  The drug eluting rod was then deployed by pulling back the slider per the manufactures recommendation.  The implant was palpable by the clinician as well as the patient.  The insertion site covered dressed with a band aid before applying  a kerlex bandage pressure dressing..Minimal blood loss was noted during the procedure.  The patientt tolerated the procedure well.   She was instructed to wear the bandage for 24 hours, call with any signs of infection.  She was given the Nexplanon card and instructed to have the rod removed in 3 years.  Assessment: 31 y.o. [id number]  presenting for 6 week postpartum visit  Plan: Problem List Items Addressed This Visit    None    Visit Diagnoses    Postpartum care and examination    -  Primary   Insertion of Nexplanon       Relevant Medications   etonogestrel (NEXPLANON) implant 68 mg     1) Contraception: Previously elected to use Nexplanon.  Placed today.  See procedure note.  2)  Pap - ASCCP guidelines and rational discussed.  Patient opts for routine screening interval  3) Patient underwent screening for postpartum depression with no concerns noted.  4) Follow up 1 year for routine annual exam  Thomasene Mohair, MD 07/13/2017 9:00 PM

## 2017-09-30 ENCOUNTER — Encounter: Payer: Self-pay | Admitting: *Deleted

## 2017-09-30 ENCOUNTER — Other Ambulatory Visit: Payer: Self-pay

## 2017-09-30 ENCOUNTER — Emergency Department
Admission: EM | Admit: 2017-09-30 | Discharge: 2017-09-30 | Disposition: A | Discharge status: Home / Self Care | Payer: Medicaid Other | Attending: Emergency Medicine | Admitting: Emergency Medicine

## 2017-09-30 DIAGNOSIS — Z87891 Personal history of nicotine dependence: Secondary | ICD-10-CM | POA: Insufficient documentation

## 2017-09-30 DIAGNOSIS — N739 Female pelvic inflammatory disease, unspecified: Secondary | ICD-10-CM | POA: Insufficient documentation

## 2017-09-30 LAB — COMPREHENSIVE METABOLIC PANEL
ALBUMIN: 4.6 g/dL (ref 3.5–5.0)
ALT: 18 U/L (ref 14–54)
ANION GAP: 10 (ref 5–15)
AST: 18 U/L (ref 15–41)
Alkaline Phosphatase: 78 U/L (ref 38–126)
BILIRUBIN TOTAL: 1 mg/dL (ref 0.3–1.2)
BUN: 11 mg/dL (ref 6–20)
CO2: 23 mmol/L (ref 22–32)
Calcium: 9.6 mg/dL (ref 8.9–10.3)
Chloride: 107 mmol/L (ref 101–111)
Creatinine, Ser: 0.63 mg/dL (ref 0.44–1.00)
GFR calc non Af Amer: >60 mL/min (ref >60)
Glucose, Bld: 85 mg/dL (ref 65–99)
POTASSIUM: 3.7 mmol/L (ref 3.5–5.1)
Sodium: 140 mmol/L (ref 135–145)
TOTAL PROTEIN: 8.2 g/dL — AB (ref 6.5–8.1)

## 2017-09-30 LAB — LIPASE, BLOOD: Lipase: 36 U/L (ref 11–51)

## 2017-09-30 LAB — CBC
HEMATOCRIT: 42.8 % (ref 35.0–47.0)
HEMOGLOBIN: 13.6 g/dL (ref 12.0–16.0)
MCH: 27.9 pg (ref 26.0–34.0)
MCHC: 31.8 g/dL — ABNORMAL LOW (ref 32.0–36.0)
MCV: 87.7 fL (ref 80.0–100.0)
Platelets: 251 10*3/uL (ref 150–440)
RBC: 4.88 MIL/uL (ref 3.80–5.20)
RDW: 14.6 % — ABNORMAL HIGH (ref 11.5–14.5)
WBC: 6.5 10*3/uL (ref 3.6–11.0)

## 2017-09-30 LAB — URINALYSIS, COMPLETE (UACMP) WITH MICROSCOPIC
BACTERIA UA: NONE SEEN
BILIRUBIN URINE: NEGATIVE
GLUCOSE, UA: NEGATIVE mg/dL
HGB URINE DIPSTICK: NEGATIVE
Ketones, ur: NEGATIVE mg/dL
LEUKOCYTES UA: NEGATIVE
NITRITE: NEGATIVE
PROTEIN: NEGATIVE mg/dL
Specific Gravity, Urine: 1.015 (ref 1.005–1.030)
WBC UA: NONE SEEN WBC/hpf (ref 0–5)
pH: 7 (ref 5.0–8.0)

## 2017-09-30 LAB — WET PREP, GENITAL
CLUE CELLS WET PREP: NONE SEEN
Sperm: NONE SEEN
Trich, Wet Prep: NONE SEEN
Yeast Wet Prep HPF POC: NONE SEEN

## 2017-09-30 LAB — CHLAMYDIA/NGC RT PCR (ARMC ONLY)
Chlamydia Tr: NOT DETECTED
N GONORRHOEAE: NOT DETECTED

## 2017-09-30 LAB — POCT PREGNANCY, URINE: PREG TEST UR: NEGATIVE

## 2017-09-30 MED ORDER — CEFTRIAXONE SODIUM 250 MG IJ SOLR
250.0000 mg | Freq: Once | INTRAMUSCULAR | Status: AC
  Administered 2017-09-30: 250 mg via INTRAMUSCULAR

## 2017-09-30 MED ORDER — IBUPROFEN 400 MG PO TABS
600.0000 mg | ORAL_TABLET | Freq: Once | ORAL | Status: AC
  Administered 2017-09-30: 600 mg via ORAL

## 2017-09-30 MED ORDER — DOXYCYCLINE HYCLATE 100 MG PO TABS
ORAL_TABLET | ORAL | Status: AC
  Administered 2017-09-30: 100 mg via ORAL
  Filled 2017-09-30: qty 1

## 2017-09-30 MED ORDER — CEFTRIAXONE SODIUM 250 MG IJ SOLR
INTRAMUSCULAR | Status: AC
  Filled 2017-09-30: qty 250

## 2017-09-30 MED ORDER — DOXYCYCLINE HYCLATE 100 MG PO TABS
100.0000 mg | ORAL_TABLET | Freq: Once | ORAL | Status: AC
  Administered 2017-09-30: 100 mg via ORAL

## 2017-09-30 MED ORDER — DOXYCYCLINE HYCLATE 100 MG PO CAPS: 100.0000 mg | ORAL_CAPSULE | Freq: Two times a day (BID) | ORAL | 0 refills | Status: DC

## 2017-09-30 MED ORDER — IBUPROFEN 400 MG PO TABS
ORAL_TABLET | ORAL | Status: AC
  Filled 2017-09-30: qty 2

## 2017-09-30 NOTE — ED Triage Notes (Signed)
Pt has low abd pain for 3 days.  Pt reports abd cramping.  No n/v/d.  Pt has vag discharge.  No urinary sx.  No vag bleeding.  Pt alert.
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
## 2017-09-30 NOTE — ED Notes (Signed)
poct pregnancy Negative 

## 2017-09-30 NOTE — ED Notes (Signed)
Pelvic Cart set up at bedside; EDP aware. 

## 2018-02-02 ENCOUNTER — Ambulatory Visit
Admission: EM | Admit: 2018-02-02 | Discharge: 2018-02-02 | Disposition: A | Discharge status: Home / Self Care | Payer: Self-pay | Attending: Family Medicine | Admitting: Family Medicine

## 2018-02-02 ENCOUNTER — Ambulatory Visit (INDEPENDENT_AMBULATORY_CARE_PROVIDER_SITE_OTHER): Payer: Self-pay

## 2018-02-02 DIAGNOSIS — S29019A Strain of muscle and tendon of unspecified wall of thorax, initial encounter: Secondary | ICD-10-CM

## 2018-02-02 DIAGNOSIS — S29012A Strain of muscle and tendon of back wall of thorax, initial encounter: Secondary | ICD-10-CM

## 2018-02-02 DIAGNOSIS — M546 Pain in thoracic spine: Secondary | ICD-10-CM

## 2018-02-02 DIAGNOSIS — R0602 Shortness of breath: Secondary | ICD-10-CM

## 2018-02-02 MED ORDER — KETOROLAC TROMETHAMINE 60 MG/2ML IM SOLN
60.0000 mg | Freq: Once | INTRAMUSCULAR | Status: AC
  Administered 2018-02-02: 60 mg via INTRAMUSCULAR

## 2018-02-02 MED ORDER — TIZANIDINE HCL 4 MG PO CAPS: 4.0000 mg | ORAL_CAPSULE | Freq: Three times a day (TID) | ORAL | 0 refills | Status: DC

## 2018-02-02 MED ORDER — NAPROXEN 500 MG PO TABS: 500.0000 mg | ORAL_TABLET | Freq: Two times a day (BID) | ORAL | 0 refills | Status: DC

## 2018-02-02 NOTE — ED Notes (Signed)
Reporting really just issues with the rib area below axilla.   No other issues, stretches, falls, or other traumas reported.

## 2018-02-02 NOTE — ED Notes (Signed)
Pt in treatment room.  In NAD.  Needs address.  Pt/Fam updated on POC.   Pt reporting minimal decrease in pain.

## 2018-02-02 NOTE — ED Notes (Signed)
Patient transported to X-ray 
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
## 2018-02-02 NOTE — Discharge Instructions (Addendum)
Apply ice 20 minutes out of every 2 hours 4-5 times daily for comfort.  °

## 2018-02-02 NOTE — ED Triage Notes (Signed)
Noticed right side abd pain around 12 or 1 today.  Was sitting getting nails done when pain started.  Hadn't eaten lunch yet.  Only had soda today and water.  No new foods, water, excercises reported.   No dysuria.  Last BM this morning.   Reports some pain with deep breathing.   All right side.

## 2018-03-08 ENCOUNTER — Encounter: Payer: Self-pay | Admitting: Emergency Medicine

## 2018-03-08 ENCOUNTER — Other Ambulatory Visit: Payer: Self-pay

## 2018-03-08 ENCOUNTER — Emergency Department
Admission: EM | Admit: 2018-03-08 | Discharge: 2018-03-08 | Disposition: A | Discharge status: Home / Self Care | Payer: Self-pay | Attending: Student in an Organized Health Care Education/Training Program | Admitting: Student in an Organized Health Care Education/Training Program

## 2018-03-08 DIAGNOSIS — F419 Anxiety disorder, unspecified: Secondary | ICD-10-CM | POA: Insufficient documentation

## 2018-03-08 DIAGNOSIS — Z79899 Other long term (current) drug therapy: Secondary | ICD-10-CM | POA: Insufficient documentation

## 2018-03-08 DIAGNOSIS — L42 Pityriasis rosea: Secondary | ICD-10-CM | POA: Insufficient documentation

## 2018-03-08 DIAGNOSIS — Z87891 Personal history of nicotine dependence: Secondary | ICD-10-CM | POA: Insufficient documentation

## 2018-03-08 LAB — GROUP A STREP BY PCR: Group A Strep by PCR: NOT DETECTED

## 2018-03-08 MED ORDER — TRIAMCINOLONE ACETONIDE 0.1 % EX CREA: 1.0000 "application " | TOPICAL_CREAM | Freq: Two times a day (BID) | CUTANEOUS | 0 refills | Status: DC

## 2018-03-08 NOTE — ED Triage Notes (Signed)
PT to ED via POV with c/o rash xfew days to abd and upper arms. C/o itching.
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
## 2018-03-08 NOTE — ED Notes (Addendum)
See triage note  Presents with a slight rash  States she noticed some areas to both arms and abd  Positive itching  No resp distress low grade fever on arrival

## 2018-05-12 LAB — HM HIV SCREENING LAB: HM HIV Screening: NEGATIVE

## 2018-10-05 ENCOUNTER — Encounter: Payer: Self-pay | Admitting: Emergency Medicine

## 2018-10-05 ENCOUNTER — Emergency Department: Payer: Self-pay

## 2018-10-05 ENCOUNTER — Emergency Department
Admission: EM | Admit: 2018-10-05 | Discharge: 2018-10-05 | Disposition: A | Discharge status: Home / Self Care | Payer: Self-pay | Attending: Emergency Medicine | Admitting: Emergency Medicine

## 2018-10-05 ENCOUNTER — Other Ambulatory Visit: Payer: Self-pay

## 2018-10-05 DIAGNOSIS — J189 Pneumonia, unspecified organism: Secondary | ICD-10-CM

## 2018-10-05 DIAGNOSIS — H9201 Otalgia, right ear: Secondary | ICD-10-CM | POA: Insufficient documentation

## 2018-10-05 DIAGNOSIS — Z87891 Personal history of nicotine dependence: Secondary | ICD-10-CM | POA: Insufficient documentation

## 2018-10-05 DIAGNOSIS — M7918 Myalgia, other site: Secondary | ICD-10-CM | POA: Insufficient documentation

## 2018-10-05 DIAGNOSIS — R509 Fever, unspecified: Secondary | ICD-10-CM | POA: Insufficient documentation

## 2018-10-05 DIAGNOSIS — J181 Lobar pneumonia, unspecified organism: Secondary | ICD-10-CM | POA: Insufficient documentation

## 2018-10-05 LAB — INFLUENZA PANEL BY PCR (TYPE A & B)
Influenza A By PCR: NEGATIVE
Influenza B By PCR: NEGATIVE

## 2018-10-05 MED ORDER — BENZONATATE 200 MG PO CAPS: 200.0000 mg | ORAL_CAPSULE | Freq: Three times a day (TID) | ORAL | 0 refills | Status: DC | PRN

## 2018-10-05 MED ORDER — LIDOCAINE HCL (PF) 1 % IJ SOLN
5.0000 mL | Freq: Once | INTRAMUSCULAR | Status: AC
Start: 2018-10-05 — End: 2018-10-05
  Administered 2018-10-05: 5 mL via INTRADERMAL
  Filled 2018-10-05: qty 5

## 2018-10-05 MED ORDER — AZITHROMYCIN 250 MG PO TABS: ORAL_TABLET | ORAL | 0 refills | Status: DC

## 2018-10-05 MED ORDER — CEFTRIAXONE SODIUM 1 G IJ SOLR
1.0000 g | Freq: Once | INTRAMUSCULAR | Status: AC
  Administered 2018-10-05: 1 g via INTRAMUSCULAR
  Filled 2018-10-05: qty 10
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
## 2018-10-05 NOTE — ED Triage Notes (Signed)
Says sick for 1 week.  Started with cough and then ears uhurt, and now body aches and hurts when she breaths.

## 2018-10-05 NOTE — Discharge Instructions (Signed)
Follow-up with your regular doctor if not better in 3 days.  Use the medications as prescribed.  Return emergency department if worsening.

## 2018-10-05 NOTE — ED Notes (Signed)
See triage note  Presents with cough,body aches and ear pain  States sx's started couple of days ago  Afebrile on arrival

## 2018-10-09 ENCOUNTER — Emergency Department
Admission: EM | Admit: 2018-10-09 | Discharge: 2018-10-09 | Disposition: A | Discharge status: Home / Self Care | Payer: Self-pay | Attending: Emergency Medicine | Admitting: Emergency Medicine

## 2018-10-09 ENCOUNTER — Other Ambulatory Visit: Payer: Self-pay

## 2018-10-09 ENCOUNTER — Emergency Department: Payer: Self-pay

## 2018-10-09 DIAGNOSIS — Y939 Activity, unspecified: Secondary | ICD-10-CM | POA: Insufficient documentation

## 2018-10-09 DIAGNOSIS — X58XXXA Exposure to other specified factors, initial encounter: Secondary | ICD-10-CM | POA: Insufficient documentation

## 2018-10-09 DIAGNOSIS — Z87891 Personal history of nicotine dependence: Secondary | ICD-10-CM | POA: Insufficient documentation

## 2018-10-09 DIAGNOSIS — R05 Cough: Secondary | ICD-10-CM | POA: Insufficient documentation

## 2018-10-09 DIAGNOSIS — Z09 Encounter for follow-up examination after completed treatment for conditions other than malignant neoplasm: Secondary | ICD-10-CM

## 2018-10-09 DIAGNOSIS — Y929 Unspecified place or not applicable: Secondary | ICD-10-CM | POA: Insufficient documentation

## 2018-10-09 DIAGNOSIS — S29011A Strain of muscle and tendon of front wall of thorax, initial encounter: Secondary | ICD-10-CM | POA: Insufficient documentation

## 2018-10-09 DIAGNOSIS — R0789 Other chest pain: Secondary | ICD-10-CM | POA: Insufficient documentation

## 2018-10-09 DIAGNOSIS — Y999 Unspecified external cause status: Secondary | ICD-10-CM | POA: Insufficient documentation

## 2018-10-09 LAB — CBC
HCT: 40.2 % (ref 36.0–46.0)
HEMOGLOBIN: 12.7 g/dL (ref 12.0–15.0)
MCH: 29.4 pg (ref 26.0–34.0)
MCHC: 31.6 g/dL (ref 30.0–36.0)
MCV: 93.1 fL (ref 80.0–100.0)
PLATELETS: 294 10*3/uL (ref 150–400)
RBC: 4.32 MIL/uL (ref 3.87–5.11)
RDW: 12.4 % (ref 11.5–15.5)
WBC: 8.2 10*3/uL (ref 4.0–10.5)
nRBC: 0 % (ref 0.0–0.2)

## 2018-10-09 LAB — BASIC METABOLIC PANEL
Anion gap: 5 (ref 5–15)
BUN: 10 mg/dL (ref 6–20)
CALCIUM: 9.4 mg/dL (ref 8.9–10.3)
CO2: 27 mmol/L (ref 22–32)
Chloride: 107 mmol/L (ref 98–111)
Creatinine, Ser: 0.54 mg/dL (ref 0.44–1.00)
GFR calc Af Amer: >60 mL/min (ref >60)
GLUCOSE: 84 mg/dL (ref 70–99)
POTASSIUM: 3.6 mmol/L (ref 3.5–5.1)
Sodium: 139 mmol/L (ref 135–145)

## 2018-10-09 LAB — TROPONIN I

## 2018-10-09 MED ORDER — SODIUM CHLORIDE 0.9% FLUSH: 3.0000 mL | Freq: Once | INTRAVENOUS | Status: DC

## 2018-10-09 MED ORDER — CYCLOBENZAPRINE HCL 5 MG PO TABS: 5.0000 mg | ORAL_TABLET | Freq: Three times a day (TID) | ORAL | 0 refills | Status: AC | PRN

## 2018-10-09 MED ORDER — IPRATROPIUM-ALBUTEROL 0.5-2.5 (3) MG/3ML IN SOLN
3.0000 mL | Freq: Once | RESPIRATORY_TRACT | Status: AC
  Administered 2018-10-09: 3 mL via RESPIRATORY_TRACT
  Filled 2018-10-09: qty 3

## 2018-10-09 NOTE — ED Notes (Signed)
Pt reports diagnosed with Pneumonia on Tuesday; finished Z-pack as directed but not feeling any better; lungs clear; pt talking in complete coherent sentences;
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
## 2018-10-09 NOTE — ED Notes (Signed)
First Nurse Note: Pt arrives with complaints of shortness of breath. Pt in NAD. Pt states she was diagnosed with pneumonia on Tuesday.

## 2018-10-09 NOTE — ED Triage Notes (Signed)
Pt dx with pneumonia on Tuesday. Finished antibiotics (zpack). States she doesn't feel better. A&O, ambulatory. States chest is tight. Wants something to relax d/t stress.

## 2018-12-25 ENCOUNTER — Other Ambulatory Visit: Payer: Self-pay

## 2018-12-25 ENCOUNTER — Emergency Department
Admission: EM | Admit: 2018-12-25 | Discharge: 2018-12-25 | Disposition: A | Discharge status: Home / Self Care | Payer: Self-pay | Attending: Student in an Organized Health Care Education/Training Program | Admitting: Student in an Organized Health Care Education/Training Program

## 2018-12-25 DIAGNOSIS — Z87891 Personal history of nicotine dependence: Secondary | ICD-10-CM | POA: Insufficient documentation

## 2018-12-25 DIAGNOSIS — J029 Acute pharyngitis, unspecified: Secondary | ICD-10-CM | POA: Insufficient documentation

## 2018-12-25 LAB — GROUP A STREP BY PCR: Group A Strep by PCR: NOT DETECTED

## 2018-12-25 MED ORDER — DEXAMETHASONE SODIUM PHOSPHATE 10 MG/ML IJ SOLN
10.0000 mg | Freq: Once | INTRAMUSCULAR | Status: AC
  Administered 2018-12-25: 10 mg via INTRAMUSCULAR
  Filled 2018-12-25: qty 1
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
## 2018-12-25 NOTE — ED Notes (Signed)
Pt was masked in triage.

## 2018-12-25 NOTE — ED Triage Notes (Signed)
Pt states has been smoking more cigarettes than usual and drinking etoh more than usual. Pt states for a week she has had a sore throat that she attributes to the increase in smoking and etoh. Pt denies known fever. Pt denies cough.

## 2018-12-25 NOTE — Discharge Instructions (Addendum)
You were seen today for sore throat. Your strep test is negative. We gave you a steroid shot to help reduce the pain and swelling in your throat. You can take Ibuprofen 600 mg every 8 hours with food if needed for pain and inflammation. Follow up with PCP if pain persists or worsens

## 2019-01-11 ENCOUNTER — Other Ambulatory Visit: Payer: Self-pay

## 2019-01-11 ENCOUNTER — Encounter: Payer: Self-pay | Admitting: Emergency Medicine

## 2019-01-11 ENCOUNTER — Emergency Department
Admission: EM | Admit: 2019-01-11 | Discharge: 2019-01-11 | Disposition: A | Discharge status: Home / Self Care | Payer: Self-pay | Attending: Emergency Medicine | Admitting: Emergency Medicine

## 2019-01-11 DIAGNOSIS — J0100 Acute maxillary sinusitis, unspecified: Secondary | ICD-10-CM | POA: Insufficient documentation

## 2019-01-11 DIAGNOSIS — J309 Allergic rhinitis, unspecified: Secondary | ICD-10-CM | POA: Insufficient documentation

## 2019-01-11 DIAGNOSIS — R590 Localized enlarged lymph nodes: Secondary | ICD-10-CM | POA: Insufficient documentation

## 2019-01-11 DIAGNOSIS — Z87891 Personal history of nicotine dependence: Secondary | ICD-10-CM | POA: Insufficient documentation

## 2019-01-11 DIAGNOSIS — M5412 Radiculopathy, cervical region: Secondary | ICD-10-CM | POA: Insufficient documentation

## 2019-01-11 MED ORDER — FLUTICASONE PROPIONATE 50 MCG/ACT NA SUSP: 1.0000 | Freq: Two times a day (BID) | NASAL | 0 refills | Status: DC

## 2019-01-11 MED ORDER — MELOXICAM 15 MG PO TABS: 15.0000 mg | ORAL_TABLET | Freq: Every day | ORAL | 0 refills | Status: DC

## 2019-01-11 MED ORDER — AMOXICILLIN-POT CLAVULANATE 875-125 MG PO TABS: 1.0000 | ORAL_TABLET | Freq: Two times a day (BID) | ORAL | 0 refills | Status: DC

## 2019-01-11 MED ORDER — METHOCARBAMOL 500 MG PO TABS: 500.0000 mg | ORAL_TABLET | Freq: Every day | ORAL | 0 refills | Status: DC

## 2019-01-11 MED ORDER — CETIRIZINE HCL 10 MG PO TABS: 10.0000 mg | ORAL_TABLET | Freq: Every day | ORAL | 0 refills | Status: DC

## 2019-01-11 NOTE — ED Triage Notes (Signed)
Pt states sore lymph nodes on the right side behind her ear, was reading online yesterday if you have swollen lymph nodes for a long time it could be cancer. Denies fever or cough, pinched nerve on right side of back between shoulder blades has been bothering her recently as well. NAD.

## 2019-01-11 NOTE — ED Notes (Signed)
See triage note  States she was seen about 2 weeks ago for sore throat and slight cough with swollen glands  States she was given steroid injection  States she is still having some swelling to right side of neck and throat  Denies any fever or cough at present and has low grade fever of arrival    Describes some discomfort as pressure    Also states she has a hx of pinched nerve to same side
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
## 2019-05-31 ENCOUNTER — Ambulatory Visit: Payer: Self-pay

## 2019-06-03 ENCOUNTER — Encounter: Payer: Self-pay | Admitting: Advanced Practice Midwife

## 2019-06-03 ENCOUNTER — Other Ambulatory Visit: Payer: Self-pay

## 2019-06-03 ENCOUNTER — Ambulatory Visit: Payer: Self-pay | Admitting: Advanced Practice Midwife

## 2019-06-03 DIAGNOSIS — O9989 Other specified diseases and conditions complicating pregnancy, childbirth and the puerperium: Secondary | ICD-10-CM

## 2019-06-03 DIAGNOSIS — F17200 Nicotine dependence, unspecified, uncomplicated: Secondary | ICD-10-CM | POA: Insufficient documentation

## 2019-06-03 DIAGNOSIS — Z113 Encounter for screening for infections with a predominantly sexual mode of transmission: Secondary | ICD-10-CM

## 2019-06-03 DIAGNOSIS — Z8659 Personal history of other mental and behavioral disorders: Secondary | ICD-10-CM
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
## 2019-06-03 NOTE — Progress Notes (Signed)
Wet mount reviewed, no tx per standing order. Pt provided [facility] business card; pt accepted card and states she will call [facility], does not want a referral at this time.

## 2019-06-04 LAB — WET PREP FOR TRICH, YEAST, CLUE
Clue Cell Exam: NEGATIVE
Trichomonas Exam: NEGATIVE

## 2019-06-21 ENCOUNTER — Telehealth: Payer: Self-pay | Admitting: General Practice

## 2019-06-21 NOTE — Telephone Encounter (Signed)
This RN returned call to pt at phone # provided in chart. Verified I was speaking with pt with pt name, DOB and password. Pt wanting to know about TR's from her 06/03/2019 appt. Counseled pt regarding negative GC and Chlamydia TR's. Pt states understanding. Pt with no other questions or concerns at this time.Ronny Bacon, RN

## 2019-06-21 NOTE — Telephone Encounter (Signed)
Wanted to know if TR are in.

## 2019-06-21 NOTE — Telephone Encounter (Signed)
WANTED TO SEE IF TR WERE IN

## 2019-07-11 ENCOUNTER — Other Ambulatory Visit: Payer: Self-pay

## 2019-07-11 DIAGNOSIS — Z20822 Contact with and (suspected) exposure to covid-19: Secondary | ICD-10-CM

## 2019-07-12 LAB — NOVEL CORONAVIRUS, NAA: SARS-CoV-2, NAA: NOT DETECTED

## 2020-02-23 IMAGING — CR DG CHEST 2V
2 series · 2 of 2 positions shown · non-contrast
Comparison: Chest x-ray of 05/22/2013

CLINICAL DATA: Some shortness of breath, posterior mid to right
thoracic spine pain over the last few hours

EXAM:
CHEST - 2 VIEW

[chest pa]
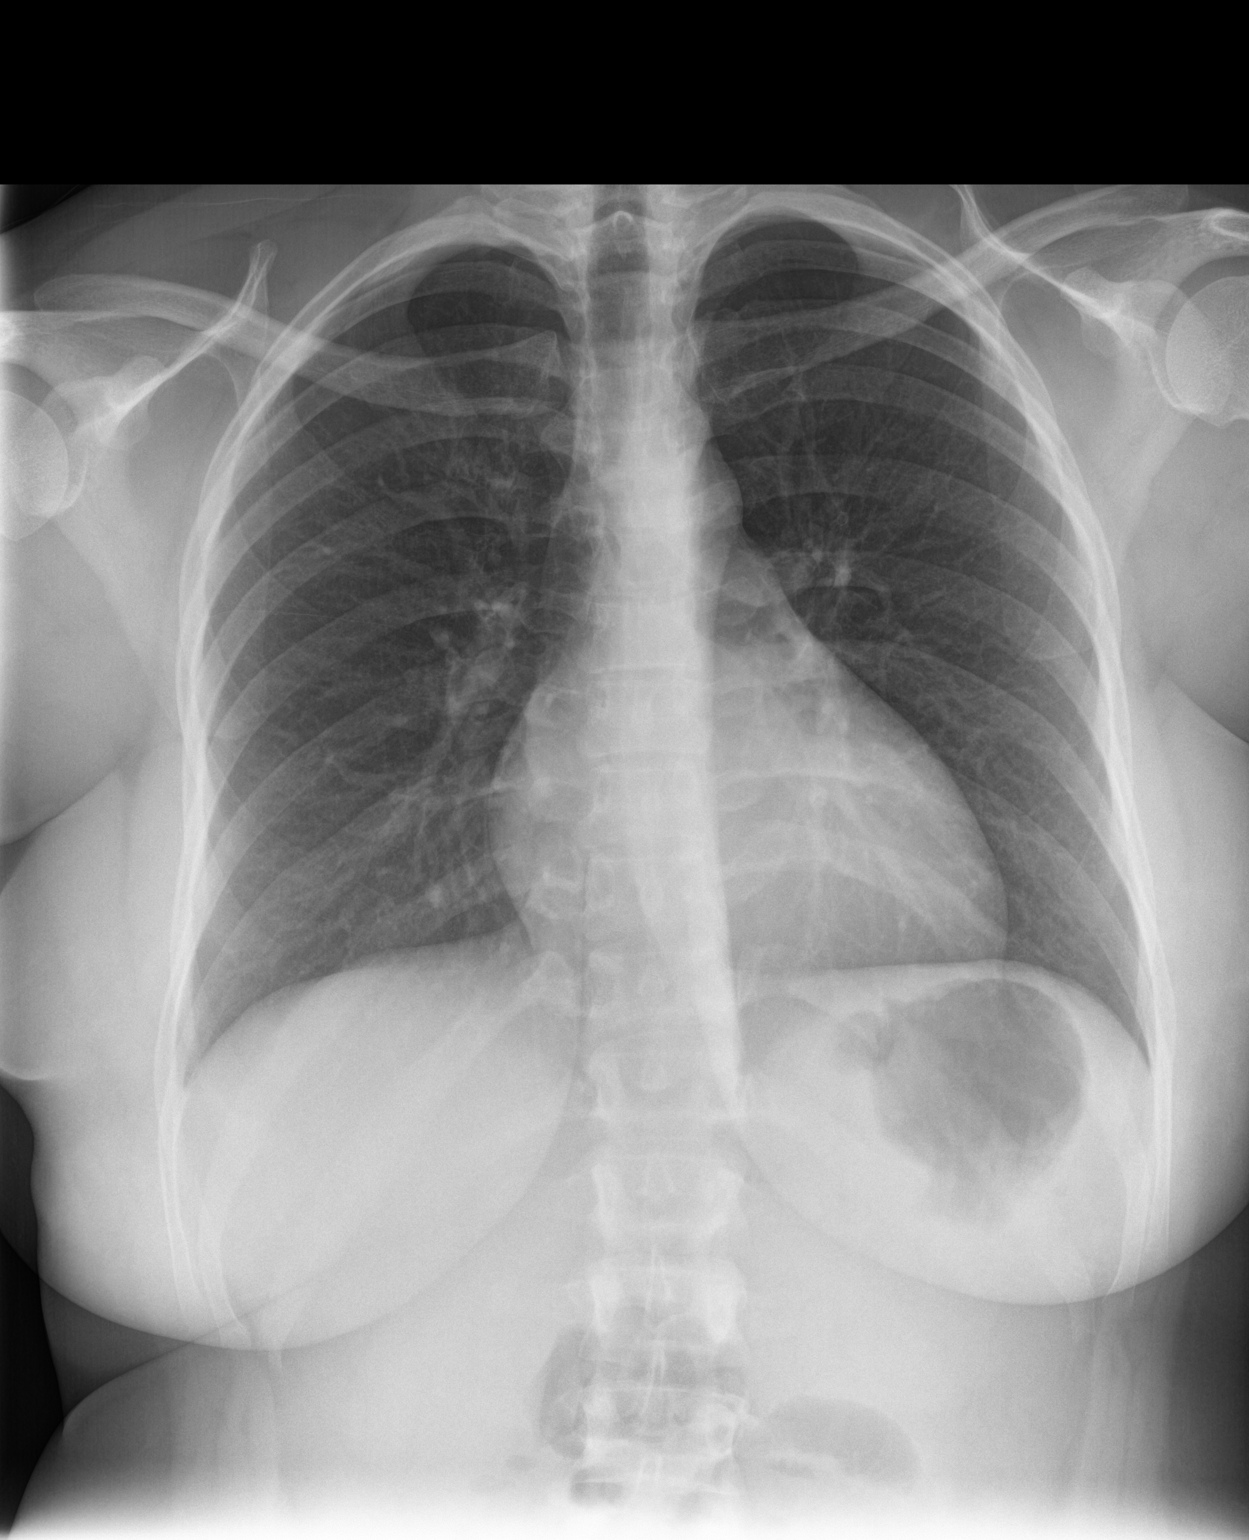

[chest lat]
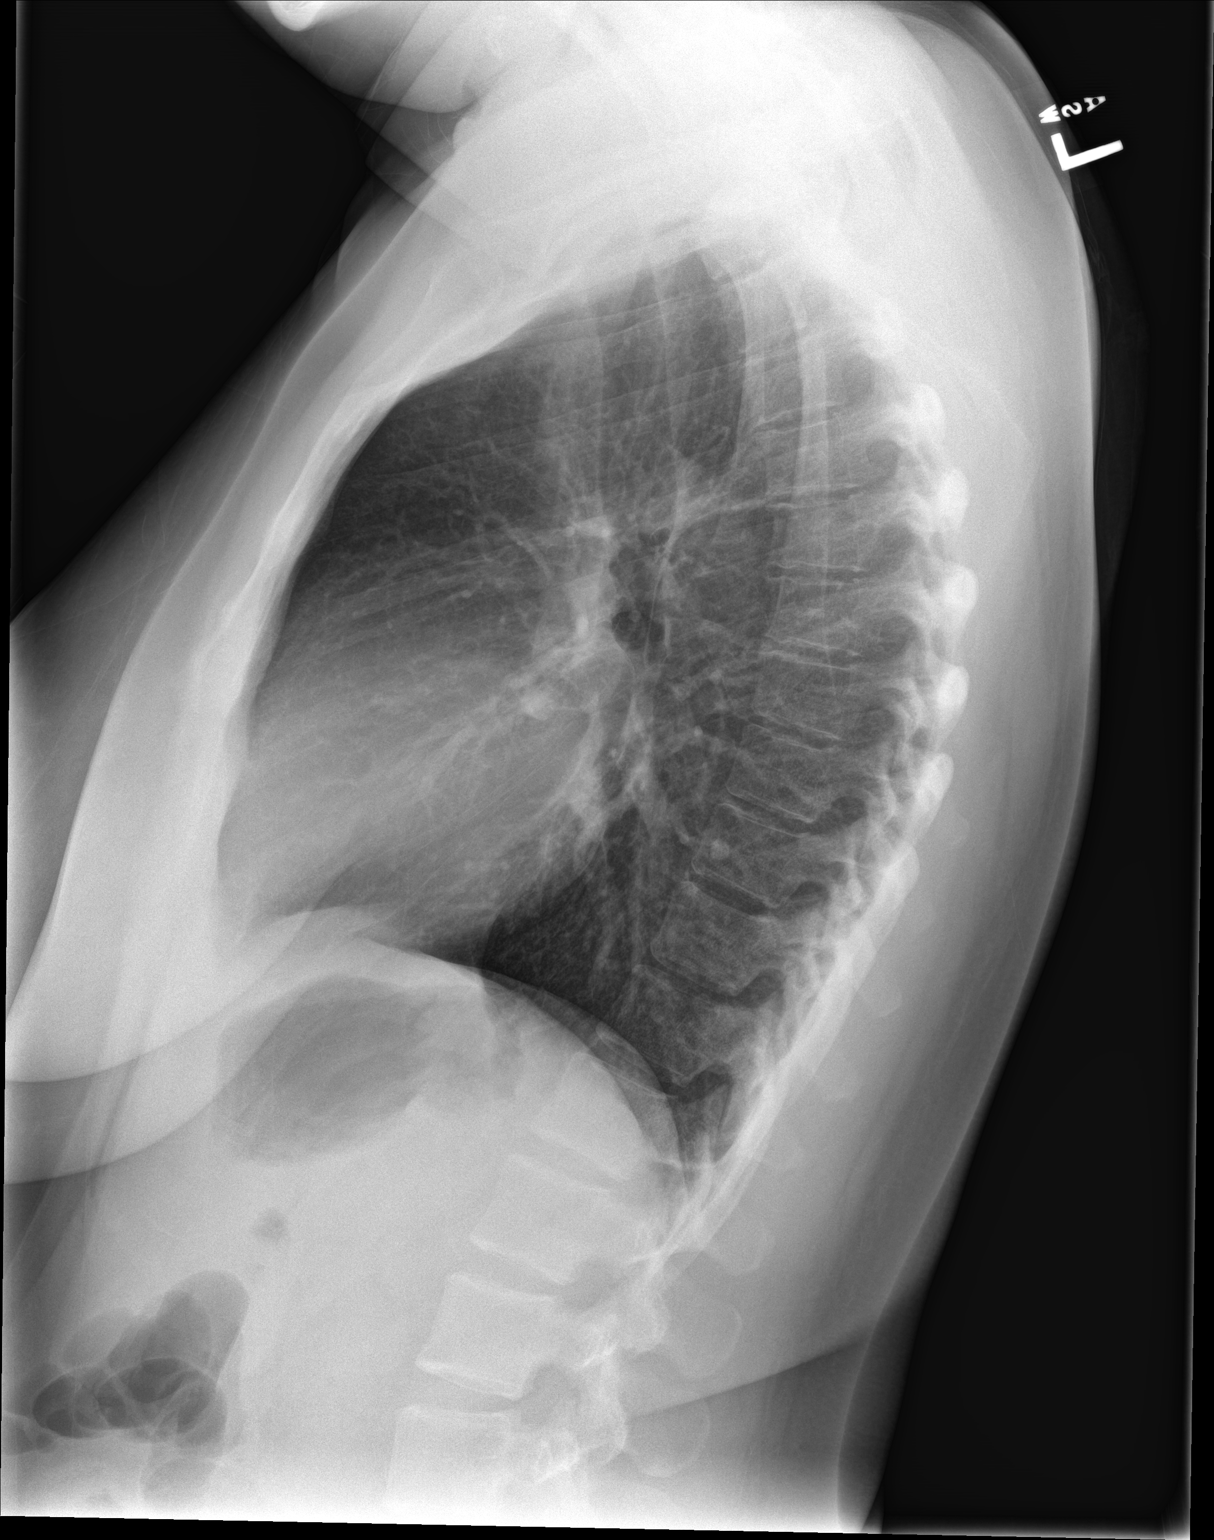

[2 of 2 positions shown; findings below may reference images not displayed]

FINDINGS: No active infiltrate or effusion is seen. Mediastinal and hilar
contours are unremarkable. The heart is within normal limits in
size. No acute bony abnormality is seen.
IMPRESSION: No active cardiopulmonary disease.

## 2020-06-05 ENCOUNTER — Emergency Department: Payer: 59

## 2020-06-05 ENCOUNTER — Emergency Department
Admission: EM | Admit: 2020-06-05 | Discharge: 2020-06-05 | Disposition: A | Discharge status: Home / Self Care | Payer: 59 | Attending: Emergency Medicine | Admitting: Emergency Medicine

## 2020-06-05 ENCOUNTER — Other Ambulatory Visit: Payer: Self-pay

## 2020-06-05 DIAGNOSIS — M546 Pain in thoracic spine: Secondary | ICD-10-CM | POA: Insufficient documentation

## 2020-06-05 DIAGNOSIS — F17210 Nicotine dependence, cigarettes, uncomplicated: Secondary | ICD-10-CM | POA: Insufficient documentation

## 2020-06-05 DIAGNOSIS — Z8616 Personal history of COVID-19: Secondary | ICD-10-CM | POA: Insufficient documentation

## 2020-06-05 DIAGNOSIS — R0789 Other chest pain: Secondary | ICD-10-CM | POA: Insufficient documentation

## 2020-06-05 DIAGNOSIS — R0981 Nasal congestion: Secondary | ICD-10-CM | POA: Insufficient documentation

## 2020-06-05 LAB — BASIC METABOLIC PANEL
Anion gap: 10 (ref 5–15)
BUN: 13 mg/dL (ref 6–20)
CO2: 23 mmol/L (ref 22–32)
Calcium: 8.8 mg/dL — ABNORMAL LOW (ref 8.9–10.3)
Chloride: 105 mmol/L (ref 98–111)
Creatinine, Ser: 0.7 mg/dL (ref 0.44–1.00)
GFR calc Af Amer: >60 mL/min (ref >60)
GFR calc non Af Amer: >60 mL/min (ref >60)
Glucose, Bld: 142 mg/dL — ABNORMAL HIGH (ref 70–99)
Potassium: 3 mmol/L — ABNORMAL LOW (ref 3.5–5.1)
Sodium: 138 mmol/L (ref 135–145)

## 2020-06-05 LAB — CBC WITH DIFFERENTIAL/PLATELET
Abs Immature Granulocytes: 0.01 10*3/uL (ref 0.00–0.07)
Basophils Absolute: 0 10*3/uL (ref 0.0–0.1)
Basophils Relative: 1 %
Eosinophils Absolute: 0.1 10*3/uL (ref 0.0–0.5)
Eosinophils Relative: 1 %
HCT: 36.4 % (ref 36.0–46.0)
Hemoglobin: 12.1 g/dL (ref 12.0–15.0)
Immature Granulocytes: 0 %
Lymphocytes Relative: 40 %
Lymphs Abs: 2.4 10*3/uL (ref 0.7–4.0)
MCH: 30.1 pg (ref 26.0–34.0)
MCHC: 33.2 g/dL (ref 30.0–36.0)
MCV: 90.5 fL (ref 80.0–100.0)
Monocytes Absolute: 0.4 10*3/uL (ref 0.1–1.0)
Monocytes Relative: 7 %
Neutro Abs: 3.1 10*3/uL (ref 1.7–7.7)
Neutrophils Relative %: 51 %
Platelets: 269 10*3/uL (ref 150–400)
RBC: 4.02 MIL/uL (ref 3.87–5.11)
RDW: 12.7 % (ref 11.5–15.5)
WBC: 6 10*3/uL (ref 4.0–10.5)
nRBC: 0 % (ref 0.0–0.2)

## 2020-06-05 LAB — TROPONIN I (HIGH SENSITIVITY): Troponin I (High Sensitivity): 4 ng/L (ref <18)

## 2020-06-05 MED ORDER — NAPROXEN 500 MG PO TABS: 500.0000 mg | ORAL_TABLET | Freq: Two times a day (BID) | ORAL | 0 refills | Status: DC

## 2020-06-05 MED ORDER — LIDOCAINE 5 % EX PTCH
1.0000 | MEDICATED_PATCH | CUTANEOUS | Status: DC
  Administered 2020-06-05: 1 via TRANSDERMAL
  Filled 2020-06-05: qty 1

## 2020-06-05 MED ORDER — DIAZEPAM 5 MG PO TABS: 5.0000 mg | ORAL_TABLET | Freq: Two times a day (BID) | ORAL | 0 refills | Status: DC | PRN

## 2020-06-05 MED ORDER — KETOROLAC TROMETHAMINE 10 MG PO TABS
10.0000 mg | ORAL_TABLET | Freq: Once | ORAL | Status: AC
  Administered 2020-06-05: 10 mg via ORAL
  Filled 2020-06-05: qty 1

## 2020-06-05 MED ORDER — FLUTICASONE PROPIONATE 50 MCG/ACT NA SUSP: 1.0000 | Freq: Every day | NASAL | 0 refills | Status: DC

## 2020-06-05 MED ORDER — AZITHROMYCIN 250 MG PO TABS: ORAL_TABLET | ORAL | 0 refills | Status: DC
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
## 2020-06-05 NOTE — ED Triage Notes (Signed)
Pt states she had covid from 9/1-9/14 states since recovery she has had SOb with chest tightness and a catch in her back behind the shoulder blades. Pt is in NAD at present. Respirations WNL

## 2020-09-22 ENCOUNTER — Ambulatory Visit
Admission: EM | Admit: 2020-09-22 | Discharge: 2020-09-22 | Disposition: A | Discharge status: Home / Self Care | Payer: No Typology Code available for payment source

## 2020-09-22 ENCOUNTER — Encounter: Payer: Self-pay | Admitting: Emergency Medicine

## 2020-09-22 ENCOUNTER — Other Ambulatory Visit: Payer: Self-pay

## 2020-09-22 DIAGNOSIS — Z20822 Contact with and (suspected) exposure to covid-19: Secondary | ICD-10-CM
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
## 2020-09-22 NOTE — Discharge Instructions (Addendum)
If your Covid test ends up positive you may take the following supplements to help your immune system be stronger to fight this viral infection Take Quarcetin 500 mg three times a day x 7 days with Zinc 50 mg ones a day x 7 days. The quarcetin is an antiviral and anti-inflammatory supplement which helps open the zinc channels in the cell to absorb Zinc. Zinc helps decrease the virus load in your body. Take Melatonin 6-10 mg at bed time which also helps support your immune system.  Also make sure to take Vit D 5,000 IU per day with a fatty meal and Vit C 5000 mg a day until you are completely better. To prevent viral illnesses your vitamin D should be between 60-80. Stay on Vitamin D 2,000  and C  1000 mg the rest of the season.  Don't lay around, keep active and walk as much as you are able to to prevent worsening of your symptoms.  Follow up with your family Dr next week.  If you get short of breath and you are able to check  your oxygen with a pulse oxygen meter, if it gets to 92% or less, you need to go to the hospital to be admitted. If you dont have one, come back here and we will assess you.   

## 2020-09-22 NOTE — ED Triage Notes (Addendum)
Pt c/o headache, sinus pain, had negative Covid today, has taken Tylenol pta with no relief. Denies n/v/d. She is also concerned with mold in her apartment.

## 2020-10-17 ENCOUNTER — Other Ambulatory Visit: Payer: Self-pay

## 2020-10-17 ENCOUNTER — Ambulatory Visit (INDEPENDENT_AMBULATORY_CARE_PROVIDER_SITE_OTHER): Payer: 59 | Admitting: Obstetrics

## 2020-10-17 VITALS — BP 114/70 | Ht 60.0 in | Wt 168.4 lb

## 2020-10-17 DIAGNOSIS — Z3046 Encounter for surveillance of implantable subdermal contraceptive: Secondary | ICD-10-CM | POA: Diagnosis not present

## 2020-10-17 NOTE — Progress Notes (Signed)
  GYNECOLOGY PROCEDURE NOTE  Nexplanon removal discussed in detail.  Risks of infection, bleeding, nerve injury all reviewed.  Patient understands risks and desires to proceed.  Verbal consent obtained.  Patient is certain she wants the Nexplanon removed.  All questions answered.  Procedure: Patient placed in dorsal supine with left arm above head, elbow flexed at 90 degrees, arm resting on examination table.  Nexplanon identified without problems.  Betadine scrub x3.  1 ml of 1% lidocaine injected under Nexplanondevice without problems.  Sterile gloves applied.  Small 0.5cm incision made at distal tip of Nexplanon device with 11 blade scalpel.  Nexplanon brought to incision and grasped with a small kelly clamp.  Nexplanon removed intact without problems.  Pressure applied to incision.  Hemostasis obtained.  Steri-strips applied, followed by bandage and compression dressing.  Patient tolerated procedure well.  No complications.   Assessment: 35 y.o. year old female now s/p uncomplicated Nexplanon removal.  Plan: 1.  Patient given post procedure precautions and asked to call for fever, chills, redness or drainage from her incision, bleeding from incision.  She understands she will likely have a small bruise near site of removal and can remove bandage tomorrow and steri-strips in approximately 1 week.  2) Contraception she declines any formal BC today. Is not sexually active at present, and would use condoms should her status change. 3) She is over due for an annual GYN physical, and is encouraged to make an appointment.  Mirna Mires, CNM  10/17/2020 2:58 PM

## 2020-10-30 ENCOUNTER — Encounter: Payer: Self-pay | Admitting: Obstetrics

## 2020-10-30 ENCOUNTER — Other Ambulatory Visit: Payer: Self-pay

## 2020-10-30 ENCOUNTER — Ambulatory Visit (INDEPENDENT_AMBULATORY_CARE_PROVIDER_SITE_OTHER): Payer: 59 | Admitting: Obstetrics

## 2020-10-30 ENCOUNTER — Other Ambulatory Visit (HOSPITAL_COMMUNITY)
Admission: RE | Admit: 2020-10-30 | Discharge: 2020-10-30 | Disposition: A | Discharge status: Home / Self Care | Payer: No Typology Code available for payment source | Source: Ambulatory Visit | Attending: Obstetrics | Admitting: Obstetrics

## 2020-10-30 VITALS — BP 116/72 | HR 98 | Ht 60.0 in | Wt 169.0 lb

## 2020-10-30 DIAGNOSIS — Z01419 Encounter for gynecological examination (general) (routine) without abnormal findings: Secondary | ICD-10-CM

## 2020-10-30 DIAGNOSIS — Z716 Tobacco abuse counseling: Secondary | ICD-10-CM | POA: Diagnosis not present

## 2020-10-30 DIAGNOSIS — F32A Depression, unspecified: Secondary | ICD-10-CM

## 2020-10-30 DIAGNOSIS — Z113 Encounter for screening for infections with a predominantly sexual mode of transmission: Secondary | ICD-10-CM | POA: Insufficient documentation

## 2020-10-30 DIAGNOSIS — Z124 Encounter for screening for malignant neoplasm of cervix: Secondary | ICD-10-CM | POA: Diagnosis not present

## 2020-10-30 DIAGNOSIS — F419 Anxiety disorder, unspecified: Secondary | ICD-10-CM

## 2020-10-30 MED ORDER — BUPROPION HCL ER (XL) 150 MG PO TB24: 150.0000 mg | ORAL_TABLET | Freq: Every day | ORAL | 2 refills | Status: DC

## 2020-10-30 MED ORDER — BUPROPION HCL ER (XL) 150 MG PO TB24: 150.0000 mg | ORAL_TABLET | Freq: Every day | ORAL | 0 refills | Status: DC

## 2020-10-30 NOTE — Progress Notes (Signed)
Gynecology Annual Exam   [facility]P: [facility]  Chief Complaint:  Chief Complaint  Patient presents with  . Gynecologic Exam    Minimal abdominal cramps x 3-4 wks    History of Present Illness: Patient is a 35 y.o. [id number] presents for annual exam. The patient has no complaints today.   LMP: No LMP recorded. Patient has had an implant. Average Interval: regular, 28 days Duration of flow: 4 days Heavy Menses: no Clots: no Intermenstrual Bleeding: no Postcoital Bleeding: not applicable Dysmenorrhea: not applicable  The patient is not sexually active. She currently uses none for contraception. She denies dyspareunia.  The patient does perform self breast exams.  There is no notable family history of breast or ovarian cancer in her family.  The patient wears seatbelts: yes.   The patient has regular exercise: no.    The patient reports current symptoms of depression.    Review of Systems: ROS  Past Medical History:  Patient Active Problem List   Diagnosis Date Noted  . Hx of postpartum depression 2007 06/03/2019  . Smoker 5-15 cpd 06/03/2019    Counseled via 5 A's to stop smoking     Past Surgical History:  Past Surgical History:  Procedure Laterality Date  . CESAREAN SECTION  05/16/2014   FTP  . CESAREAN SECTION N/A 06/04/2017   Procedure: CESAREAN SECTION;  Surgeon: Conard Novak, MD;  Location: [facility] ORS;  Service: Obstetrics;  Laterality: N/A;  . WISDOM TOOTH EXTRACTION      Gynecologic History:  No LMP recorded. Patient has had an implant. Contraception: none  She had a Nexplanon removed  Recently as she is not seuxaully active and wanted to get off hormones. Last Pap: Results were: no abnormalities   Obstetric History: [id number]  Family History:  Family History  Problem Relation Age of Onset  . Breast cancer Maternal Grandmother 60  . AAA (abdominal aortic aneurysm) Maternal Grandmother   . Diabetes Maternal Grandmother   .  Heart disease Maternal Grandfather   . Breast cancer Cousin 40  . Hypertension Mother   . Hypertension Father   . Breast cancer Cousin 40  . Heart failure Other   . Other Half-Brother        blood disorder "runs thin"    Social History:  Social History   Socioeconomic History  . Marital status: Single    Spouse name: Not on file  . Number of children: 2  . Years of education: Not on file  . Highest education level: Not on file  Occupational History  . Occupation: Physicist, medical  Tobacco Use  . Smoking status: Current Every Day Smoker    Packs/day: 0.75    Types: Cigarettes    Last attempt to quit: 12/14/2016    Years since quitting: 3.8  . Smokeless tobacco: Never Used  Vaping Use  . Vaping Use: Never used  Substance and Sexual Activity  . Alcohol use: Yes    Comment: q weekend--3 shots Tequila  . Drug use: No  . Sexual activity: Not Currently    Partners: Male    Birth control/protection: None  Other Topics Concern  . Not on file  Social History Narrative  . Not on file   Social Determinants of Health   Financial Resource Strain: Not on file  Food Insecurity: Not on file  Transportation Needs: Not on file  Physical Activity: Not on file  Stress: Not on file  Social Connections: Not on  file  Intimate Partner Violence: Not on file    Allergies:  Allergies  Allergen Reactions  . Metronidazole Rash    June 2015 June 2015  . Pork-Derived Products Diarrhea and Rash    Medications: Prior to Admission medications   Medication Sig Start Date End Date Taking? Authorizing Provider  fluticasone (FLONASE) 50 MCG/ACT nasal spray Place 1 spray into both nostrils daily. 06/05/20 07/05/20  Sharman Cheek, MD  cetirizine (ZYRTEC) 10 MG tablet Take 1 tablet (10 mg total) by mouth daily. 01/11/19 09/22/20  Cuthriell, Delorise Royals, PA-C    Physical Exam Vitals: Blood pressure 116/72, pulse 98, height 5' (1.524 m), weight 169 lb (76.7 kg), unknown if currently  breastfeeding.  General: NAD HEENT: normocephalic, anicteric Thyroid: no enlargement, no palpable nodules Pulmonary: No increased work of breathing, CTAB Cardiovascular: RRR, distal pulses 2+ Breast: Breast symmetrical, no tenderness, no palpable nodules or masses, no skin or nipple retraction present, no nipple discharge.  No axillary or supraclavicular lymphadenopathy. Abdomen: NABS, soft, non-tender, non-distended.  Umbilicus without lesions.  No hepatomegaly, splenomegaly or masses palpable. No evidence of hernia  Genitourinary:  External: Normal external female genitalia.  Normal urethral meatus, normal Bartholin's and Skene's glands.    Vagina: Normal vaginal mucosa, no evidence of prolapse.    Cervix: Grossly normal in appearance, no bleeding  Uterus: Non-enlarged, mobile, normal contour.  No CMT  Adnexa: ovaries non-enlarged, no adnexal masses  Rectal: deferred  Lymphatic: no evidence of inguinal lymphadenopathy Extremities: no edema, erythema, or tenderness Neurologic: Grossly intact Psychiatric: mood appropriate, affect full  Female chaperone present for pelvic and breast  portions of the physical exam    Assessment: 35 y.o. G3P3003 routine annual exam  Plan: Problem List Items Addressed This Visit   None   Visit Diagnoses    Encounter for Papanicolaou smear for cervical cancer screening    -  Primary   Relevant Orders   Cytology - PAP   Routine screening for STI (sexually transmitted infection)       Relevant Orders   Cytology - PAP   Anxiety-like symptoms       Anxiety and depression       Well woman exam with routine gynecological exam          2) STI screening  was offered and declined  2)  ASCCP guidelines and rational discussed.  Patient opts for every 3 years screening interval  3) Contraception - the patient is currently using  none.  She is is content to remain off BC as she is abtinant at the moment.  4) Routine healthcare maintenance including  cholesterol, diabetes screening discussed managed by PCP   Additional Problem:    She filled out the PHQ and the GAD questionnaires after sharing that she has anxiety symptoms and some hx of depression. Based on her forms, her scores did not place her in a range for depression , nor anxiety that would support the use of medication. However, she is interested in quitting her tobacco use- the Wellbutrin may help with both problems just described.  A trial of Wellbutrin is prescribed for her. Additional time spent evaluating her for mood disorder and reviewing her mental health history  An additional charge is added for the time spent in evaluating her mixed mood issues , developing a plan of care and documentation. The total time spent with this additional problem was 20 minutes and the coding was used to apply to this secondary problem.   Jackelyn Poling  Leodis Binet  10/30/2020 5:34 PM   [facility], [facility] 10/30/2020, 5:34 PM

## 2020-11-01 LAB — CYTOLOGY - PAP
Chlamydia: NEGATIVE
Comment: NEGATIVE
Comment: NEGATIVE
Comment: NORMAL
Diagnosis: NEGATIVE
Neisseria Gonorrhea: NEGATIVE
Trichomonas: NEGATIVE

## 2020-12-11 ENCOUNTER — Ambulatory Visit: Payer: 59 | Admitting: Obstetrics

## 2021-01-29 ENCOUNTER — Other Ambulatory Visit: Payer: Self-pay

## 2021-01-29 ENCOUNTER — Ambulatory Visit (LOCAL_COMMUNITY_HEALTH_CENTER): Payer: Self-pay | Admitting: Physician Assistant

## 2021-01-29 VITALS — BP 128/90 | Ht 60.0 in | Wt 165.0 lb

## 2021-01-29 DIAGNOSIS — Z3009 Encounter for other general counseling and advice on contraception: Secondary | ICD-10-CM

## 2021-01-29 DIAGNOSIS — Z30012 Encounter for prescription of emergency contraception: Secondary | ICD-10-CM

## 2021-01-29 MED ORDER — LEVONORGESTREL 1.5 MG PO TABS: 1.5000 mg | ORAL_TABLET | Freq: Once | ORAL | 0 refills | Status: AC

## 2021-01-29 NOTE — Progress Notes (Signed)
Plan B dispensed to pt per Sadie Haber, PA order. Provider orders completed.

## 2021-01-29 NOTE — Progress Notes (Signed)
Had sex without a condom on 01/27/21 (first sexual encounter in approx 4 years); started period after sex on 01/27/21 and has been normal (expected around 5/23).  Pt was thinking her period may make her more prone to get pregnant.

## 2021-01-31 NOTE — Progress Notes (Signed)
   [facility]  Subjective:  Jean Mckay is a 35 y.o. being seen today requesting Plan B.  Chief Complaint  Patient presents with  . Contraception    Plan B    HPI  Patient reports that she had unprotected sex on 01/27/2021.  Denies any symptoms of concern today.  Reports period started after she had sex and that it is normal.     Does the patient have a current or past history of drug use? No   No components found for: HCV]   Health Maintenance Due  Topic Date Due  . COVID-19 Vaccine (1) Never done  . Hepatitis C Screening  Never done    Review of Systems  All other systems reviewed and are negative.   The following portions of the patient's history were reviewed and updated as appropriate: allergies, current medications, past family history, past medical history, past social history, past surgical history and problem list. Problem list updated.   See flowsheet for other program required questions.  Objective:   Vitals:   01/29/21 1435  BP: 128/90  Weight: 165 lb (74.8 kg)  Height: 5' (1.524 m)    Physical Exam Vitals and nursing note reviewed.  Constitutional:      General: She is not in acute distress.    Appearance: Normal appearance.  HENT:     Head: Normocephalic.  Eyes:     Conjunctiva/sclera: Conjunctivae normal.  Pulmonary:     Effort: Pulmonary effort is normal.  Neurological:     Mental Status: She is alert and oriented to person, place, and time.  Psychiatric:        Mood and Affect: Mood normal.        Behavior: Behavior normal.        Thought Content: Thought content normal.        Judgment: Judgment normal.       Assessment and Plan:  Jean Mckay is a 34 y.o. female presenting to the [facility] Department for a Women's Health problem visit  1. Encounter for counseling regarding contraception Patient into clinic requesting Plan  B. Counseled patient re: risks, benefits, and SE of hormonal BCM. Patient states that she is not interested in any hormonal BCM today.   Reviewed with patient OTC options for birth control such as spermicides to use with condoms as options.   2. Encounter for emergency contraceptive counseling and prescription Give Plan B with instructions to patient today. Counseled patient to do a OTC pregnancy test in 2 weeks and RTC if it is positive. - levonorgestrel (PLAN B ONE-STEP) 1.5 MG tablet; Take 1 tablet (1.5 mg total) by mouth once for 1 dose.  Dispense: 1 tablet; Refill: 0     No follow-ups on file.  No future appointments.  Matt Holmes, PA

## 2021-03-19 ENCOUNTER — Telehealth: Payer: Self-pay | Admitting: Obstetrics

## 2021-03-19 NOTE — Telephone Encounter (Signed)
Patient moved and has misplaced her meds and was wondering if she could get a refill on the wellbutrin 150mg . Would like meds called to [address]   CB# [phone]

## 2021-03-20 ENCOUNTER — Other Ambulatory Visit: Payer: Self-pay | Admitting: Obstetrics

## 2021-03-20 DIAGNOSIS — Z01419 Encounter for gynecological examination (general) (routine) without abnormal findings: Secondary | ICD-10-CM

## 2021-03-20 DIAGNOSIS — Z716 Tobacco abuse counseling: Secondary | ICD-10-CM

## 2021-03-20 DIAGNOSIS — F32A Depression, unspecified: Secondary | ICD-10-CM

## 2021-03-20 MED ORDER — BUPROPION HCL ER (XL) 150 MG PO TB24: 150.0000 mg | ORAL_TABLET | Freq: Every day | ORAL | 0 refills | Status: DC

## 2021-03-26 NOTE — Telephone Encounter (Signed)
Voice mail not set-up yet.

## 2021-12-27 ENCOUNTER — Ambulatory Visit: Payer: Self-pay

## 2022-03-31 ENCOUNTER — Other Ambulatory Visit: Payer: Self-pay | Admitting: Obstetrics

## 2022-03-31 DIAGNOSIS — Z01419 Encounter for gynecological examination (general) (routine) without abnormal findings: Secondary | ICD-10-CM

## 2022-03-31 DIAGNOSIS — F419 Anxiety disorder, unspecified: Secondary | ICD-10-CM

## 2022-03-31 DIAGNOSIS — Z716 Tobacco abuse counseling: Secondary | ICD-10-CM

## 2022-04-03 NOTE — Telephone Encounter (Signed)
Pt calling to see if her request for rx has gone thru.  [phone]

## 2022-04-03 NOTE — Telephone Encounter (Signed)
Called pt to schedule annual  appt.  No answer, voice mailbox was not set up.  Last active on MyChart Oct. 2020.

## 2022-04-03 NOTE — Telephone Encounter (Signed)
Called to schedule annual so RX could be refilled.  No answer, voicemail not set up.  Last active on MyChart Oct. 2020.

## 2022-04-04 ENCOUNTER — Other Ambulatory Visit: Payer: Self-pay | Admitting: Obstetrics

## 2022-04-04 DIAGNOSIS — F32A Depression, unspecified: Secondary | ICD-10-CM

## 2022-04-04 DIAGNOSIS — Z01419 Encounter for gynecological examination (general) (routine) without abnormal findings: Secondary | ICD-10-CM

## 2022-04-04 DIAGNOSIS — Z716 Tobacco abuse counseling: Secondary | ICD-10-CM

## 2022-04-04 NOTE — Telephone Encounter (Signed)
Patient is scheduled for 06/06/22 for annual with [facility]

## 2022-04-07 NOTE — Telephone Encounter (Signed)
Patient is scheduled for 06/06/22 with [facility] for annual exam. Please advise refill request?

## 2022-04-08 ENCOUNTER — Other Ambulatory Visit: Payer: Self-pay | Admitting: Obstetrics

## 2022-04-08 DIAGNOSIS — Z01419 Encounter for gynecological examination (general) (routine) without abnormal findings: Secondary | ICD-10-CM

## 2022-04-08 DIAGNOSIS — Z716 Tobacco abuse counseling: Secondary | ICD-10-CM

## 2022-04-08 DIAGNOSIS — F419 Anxiety disorder, unspecified: Secondary | ICD-10-CM

## 2022-04-08 MED ORDER — BUPROPION HCL ER (XL) 150 MG PO TB24: 150.0000 mg | ORAL_TABLET | Freq: Every day | ORAL | 2 refills | Status: DC

## 2022-04-11 NOTE — Telephone Encounter (Signed)
Pt is scheduled with [facility] on 9/22.

## 2022-04-29 ENCOUNTER — Ambulatory Visit: Payer: Self-pay | Admitting: Advanced Practice Midwife

## 2022-04-29 ENCOUNTER — Encounter: Payer: Self-pay | Admitting: Advanced Practice Midwife

## 2022-04-29 DIAGNOSIS — Z113 Encounter for screening for infections with a predominantly sexual mode of transmission: Secondary | ICD-10-CM

## 2022-04-29 DIAGNOSIS — F418 Other specified anxiety disorders: Secondary | ICD-10-CM | POA: Insufficient documentation

## 2022-04-29 DIAGNOSIS — F419 Anxiety disorder, unspecified: Secondary | ICD-10-CM | POA: Insufficient documentation

## 2022-04-29 LAB — WET PREP FOR TRICH, YEAST, CLUE
Trichomonas Exam: NEGATIVE
Yeast Exam: NEGATIVE

## 2022-04-29 LAB — HM HIV SCREENING LAB: HM HIV Screening: NEGATIVE

## 2022-04-29 NOTE — Progress Notes (Signed)
[facility] Department  STI clinic/screening visit [address] [place] [address] [phone]  Subjective:  Jean Mckay is a 36 y.o. SBF G5P3 smoker  female being seen today for an STI screening visit. The patient reports they do not have symptoms.  Patient reports that they do not desire a pregnancy in the next year.   They reported they are not interested in discussing contraception today.    Patient's last menstrual period was 04/20/2022 (exact date).   Patient has the following medical conditions:   Patient Active Problem List   Diagnosis Date Noted   Anxiety 04/29/2022   Hx of postpartum depression 2007 06/03/2019   Smoker 5-15 cpd 06/03/2019    Chief Complaint  Patient presents with   SEXUALLY TRANSMITTED DISEASE    HPI  Patient reports asymptomatic. LMP 04/20/22. Last sex 03/2022 without condom; with current partner x 3 mo; 1 sex partner in last 3 mo. Smoking 5-7 cpd. Last vaped yesterday. Last cigar age 25. Last MJ age 89. Last ETOH yesterday (3 glasses wine) 2x/wk.   Last HIV test per patient/review of record was 05/12/18 Patient reports last pap was 10/30/20 neg  Screening for MPX risk: Does the patient have an unexplained rash? No Is the patient MSM? No Does the patient endorse multiple sex partners or anonymous sex partners? No Did the patient have close or sexual contact with a person diagnosed with MPX? No Has the patient traveled outside the [place] where MPX is endemic? No Is there a high clinical suspicion for MPX-- evidenced by one of the following No  -Unlikely to be chickenpox  -Lymphadenopathy  -Rash that present in same phase of evolution on any given body part See flowsheet for further details and programmatic requirements.   Immunization history:  Immunization History  Administered Date(s) Administered   Td 01/18/2002   Tdap 03/30/2017     The following portions of the patient's history were reviewed and  updated as appropriate: allergies, current medications, past medical history, past social history, past surgical history and problem list.  Objective:  There were no vitals filed for this visit.  Physical Exam Vitals and nursing note reviewed.  Constitutional:      Appearance: Normal appearance.  HENT:     Head: Normocephalic and atraumatic.     Mouth/Throat:     Mouth: Mucous membranes are moist.     Pharynx: Oropharynx is clear. No oropharyngeal exudate or posterior oropharyngeal erythema.  Eyes:     Conjunctiva/sclera: Conjunctivae normal.  Pulmonary:     Effort: Pulmonary effort is normal.  Abdominal:     Palpations: Abdomen is soft. There is no mass.     Tenderness: There is no abdominal tenderness. There is no rebound.     Comments: Soft without masses or tenderness, fair tone  Genitourinary:    General: Normal vulva.     Exam position: Lithotomy position.     Pubic Area: No rash or pubic lice.      Labia:        Right: No rash or lesion.        Left: No rash or lesion.      Vagina: Vaginal discharge (white creamy leukorrhea, ph<4.5) present. No erythema, bleeding or lesions.     Cervix: Normal.     Uterus: Normal.      Adnexa: Right adnexa normal and left adnexa normal.     Rectum: Normal.     Comments: pH = <4.5 Lymphadenopathy:  Head:     Right side of head: No preauricular or posterior auricular adenopathy.     Left side of head: No preauricular or posterior auricular adenopathy.     Cervical: No cervical adenopathy.     Right cervical: No superficial, deep or posterior cervical adenopathy.    Left cervical: No superficial, deep or posterior cervical adenopathy.     Upper Body:     Right upper body: No supraclavicular, axillary or epitrochlear adenopathy.     Left upper body: No supraclavicular, axillary or epitrochlear adenopathy.     Lower Body: No right inguinal adenopathy. No left inguinal adenopathy.  Skin:    General: Skin is warm and dry.      Findings: No rash.  Neurological:     Mental Status: She is alert and oriented to person, place, and time.     Assessment and Plan:  Jean Mckay is a 36 y.o. female presenting to the [facility] Department for STI screening  1. Screening examination for venereal disease Treat wet mount per standing orders Immunization nurse consult  - WET PREP FOR TRICH, YEAST, CLUE - Chlamydia/Gonorrhea [facility] - Syphilis Serology, [facility] - HIV [facility]  2. Anxiety Accepts contact info for Jean Cosier, LCSW     Return if symptoms worsen or fail to improve.  Future Appointments  Date Time Provider Department Center  06/06/2022  8:35 AM Mirna Mires, CNM [facility] None    Alberteen Spindle, CNM

## 2022-04-29 NOTE — Progress Notes (Signed)
Wet mount reviewed during clinic visit - no treatment indicated.   Seymone Forlenza Ramos, RN  

## 2022-06-05 ENCOUNTER — Other Ambulatory Visit: Payer: Self-pay

## 2022-06-05 ENCOUNTER — Emergency Department
Admission: EM | Admit: 2022-06-05 | Discharge: 2022-06-05 | Disposition: A | Discharge status: Home / Self Care | Payer: 59 | Attending: Emergency Medicine | Admitting: Emergency Medicine

## 2022-06-05 DIAGNOSIS — U071 COVID-19: Secondary | ICD-10-CM | POA: Diagnosis not present

## 2022-06-05 DIAGNOSIS — J029 Acute pharyngitis, unspecified: Secondary | ICD-10-CM | POA: Diagnosis present

## 2022-06-05 LAB — SARS CORONAVIRUS 2 BY RT PCR: SARS Coronavirus 2 by RT PCR: POSITIVE — AB

## 2022-06-05 LAB — GROUP A STREP BY PCR: Group A Strep by PCR: NOT DETECTED

## 2022-06-05 MED ORDER — LIDOCAINE VISCOUS HCL 2 % MT SOLN
15.0000 mL | Freq: Once | OROMUCOSAL | Status: AC
  Administered 2022-06-05: 15 mL via OROMUCOSAL
  Filled 2022-06-05: qty 15

## 2022-06-05 NOTE — ED Notes (Signed)
30 yof with a c/c of swollen tonsils, drainage, body aches, and a headache since last night. The pt advised her tonsils have been swollen longer.

## 2022-06-05 NOTE — Discharge Instructions (Addendum)
Follow-up with your primary care provider if any continued problems.  Return to the emergency department immediately if you develop any shortness of breath or difficulty breathing with your COVID.  You will need to quarantine for additional 9 days and also notify anyone that you have been around that they have been exposed to [place].  Tylenol or ibuprofen as needed for body aches, headache and fever.  This will also help with throat pain.  A prescription for viscous lidocaine was sent to the pharmacy for you to use as needed for throat pain especially before meals and at bedtime.  Continue to drink lots of fluids to stay hydrated and also walk around her home every hour and take deep breaths to help prevent pneumonia while you have COVID.

## 2022-06-05 NOTE — ED Provider Notes (Signed)
[facility] Provider Note    Event Date/Time   First MD Initiated Contact with Patient 06/05/22 1051     (approximate)   History   Sore Throat   HPI  Jean Mckay is a 36 y.o. female   presents to the ED with complaint of sore throat that started yesterday.  Patient is also complains of headache, fever and body aches.  She states that the highest her temperature has been was 101.      Physical Exam   Triage Vital Signs: ED Triage Vitals  Enc Vitals Group     BP 06/05/22 1023 (!) 147/107     Pulse Rate 06/05/22 1023 91     Resp 06/05/22 1023 18     Temp 06/05/22 1023 99.7 F (37.6 C)     Temp src --      SpO2 06/05/22 1023 100 %     Weight 06/05/22 1052 164 lb 14.5 oz (74.8 kg)     Height 06/05/22 1052 5' (1.524 m)     Head Circumference --      Peak Flow --      Pain Score 06/05/22 1021 7     Pain Loc --      Pain Edu? --      Excl. in GC? --     Most recent vital signs: Vitals:   06/05/22 1023 06/05/22 1231  BP: (!) 147/107 (!) 140/97  Pulse: 91 88  Resp: 18 18  Temp: 99.7 F (37.6 C)   SpO2: 100% 100%     General: Awake, no distress.  CV:  Good peripheral perfusion.  Heart regular rate and rhythm. Resp:  Normal effort.  Lungs are clear bilaterally. Abd:  No distention.  Other:  Minimal erythema noted posterior pharynx without exudate.  Uvula is midline.  Neck is supple without cervical lymphadenopathy.   ED Results / Procedures / Treatments   Labs (all labs ordered are listed, but only abnormal results are displayed) Labs Reviewed  SARS CORONAVIRUS 2 BY RT PCR - Abnormal; Notable for the following components:      Result Value   SARS Coronavirus 2 by RT PCR POSITIVE (*)    All other components within normal limits  GROUP A STREP BY PCR      PROCEDURES:  Critical Care performed:   Procedures   MEDICATIONS ORDERED IN ED: Medications  lidocaine (XYLOCAINE) 2 % viscous mouth solution 15 mL (15  mLs Mouth/Throat Given 06/05/22 1230)     IMPRESSION / MDM / ASSESSMENT AND PLAN / ED COURSE  I reviewed the triage vital signs and the nursing notes.   Differential diagnosis includes, but is not limited to, strep pharyngitis, COVID, viral pharyngitis.  36 year old female presents to the ED with complaint of sore throat that started yesterday along with headache, fever, body aches.  Physical exam as noted above.  Patient was made aware that her strep test was negative however her COVID test was positive and the reason for her symptoms.  She was given viscous lidocaine while in the ED for her throat pain.  She was made aware that she should increase fluids to stay hydrated and also she may take Tylenol or ibuprofen if needed for throat pain, body aches or fever.  A prescription for viscous lidocaine was also sent to the pharmacy to take as needed before meals and at bedtime for throat pain.      Patient's presentation is most consistent with acute complicated  illness / injury requiring diagnostic workup.  FINAL CLINICAL IMPRESSION(S) / ED DIAGNOSES   Final diagnoses:  COVID     Rx / DC Orders   ED Discharge Orders     None        Note:  This document was prepared using Dragon voice recognition software and may include unintentional dictation errors.   Johnn Hai, PA-C 06/05/22 1445    Carrie Mew, MD 06/13/22 [phone]

## 2022-06-05 NOTE — ED Triage Notes (Signed)
Pt comes with c/o sore throat that started yesterday. Pt states headache fever and body aches.

## 2022-06-06 ENCOUNTER — Ambulatory Visit: Payer: 59 | Admitting: Obstetrics

## 2022-10-19 ENCOUNTER — Emergency Department
Admission: EM | Admit: 2022-10-19 | Discharge: 2022-10-19 | Disposition: A | Discharge status: Home / Self Care | Payer: 59 | Attending: Emergency Medicine | Admitting: Emergency Medicine

## 2022-10-19 ENCOUNTER — Other Ambulatory Visit: Payer: Self-pay

## 2022-10-19 DIAGNOSIS — M545 Low back pain, unspecified: Secondary | ICD-10-CM | POA: Diagnosis present

## 2022-10-19 DIAGNOSIS — F17210 Nicotine dependence, cigarettes, uncomplicated: Secondary | ICD-10-CM | POA: Insufficient documentation

## 2022-10-19 LAB — URINALYSIS, ROUTINE W REFLEX MICROSCOPIC
Bacteria, UA: NONE SEEN
Bilirubin Urine: NEGATIVE
Glucose, UA: NEGATIVE mg/dL
Ketones, ur: NEGATIVE mg/dL
Leukocytes,Ua: NEGATIVE
Nitrite: NEGATIVE
Protein, ur: NEGATIVE mg/dL
RBC / HPF: >50 RBC/hpf (ref 0–5)
Specific Gravity, Urine: 1.016 (ref 1.005–1.030)
pH: 6 (ref 5.0–8.0)

## 2022-10-19 LAB — PREGNANCY, URINE: Preg Test, Ur: NEGATIVE

## 2022-10-19 MED ORDER — CYCLOBENZAPRINE HCL 10 MG PO TABS
5.0000 mg | ORAL_TABLET | Freq: Once | ORAL | Status: AC
  Administered 2022-10-19: 5 mg via ORAL
  Filled 2022-10-19: qty 1

## 2022-10-19 MED ORDER — CYCLOBENZAPRINE HCL 5 MG PO TABS: 5.0000 mg | ORAL_TABLET | Freq: Three times a day (TID) | ORAL | 0 refills | Status: DC | PRN

## 2022-10-19 NOTE — ED Notes (Addendum)
Pt with lower back pain x 3 days, pt concerned about STDs. Pt denies urinary symptoms

## 2022-10-19 NOTE — Discharge Instructions (Signed)
Been seen today in the emergency room for lower back pain.  Your urinalysis showed that she did not have a urinary tract infection.  I do not feel that she needed an x-ray as your pain presented more muscular in nature.  I will be prescribing you a short course of muscle relaxers.  If your pain persist or worsens please follow-up with provider clinic urgent care.

## 2022-10-19 NOTE — ED Triage Notes (Signed)
Pt reports right lower back pain since Thursday. Denies injury, no urinary symptoms. States she also would like to be checked for stds.

## 2022-10-19 NOTE — ED Provider Notes (Signed)
[facility] Emergency Department Provider Note   ____________________________________________   Event Date/Time   First MD Initiated Contact with Patient 10/19/22 1715     (approximate)  I have reviewed the triage vital signs and the nursing notes.   HISTORY  Chief Complaint Back Pain    HPI Jean Mckay is a 37 y.o. female Libby Maw to the emergency room for complaint of lower back pain, predominantly on the right.  Patient reports that 3 to 4 days ago she started with lower back pain.  She denies any injury or falls.  She denies any heavy lifting.  Patient reports that the only thing she can associate with the pain is that her menses was starting.  However, this does not appear to be menstrual cramping. Patient reports that she has taken Tylenol and ibuprofen for the pain with no relief.  She reports today that pain is a 3 out of 10.  She appears in no acute distress at this time.   Past Medical History:  Diagnosis Date   Anemia    Previous pregnancy   Anxiety    Endometriosis 2012   Gestational diabetes    controlled on diet with G2   Measles    Post partum depression 2007    Patient Active Problem List   Diagnosis Date Noted   Anxiety 04/29/2022   Hx of postpartum depression 2007 06/03/2019   Smoker 5-15 cpd 06/03/2019    Past Surgical History:  Procedure Laterality Date   CESAREAN SECTION  05/16/2014   FTP   CESAREAN SECTION N/A 06/04/2017   Procedure: CESAREAN SECTION;  Surgeon: Will Bonnet, MD;  Location: [facility] ORS;  Service: Obstetrics;  Laterality: N/A;   WISDOM TOOTH EXTRACTION      Prior to Admission medications   Medication Sig Start Date End Date Taking? Authorizing Provider  cyclobenzaprine (FLEXERIL) 5 MG tablet Take 1 tablet (5 mg total) by mouth 3 (three) times daily as needed for muscle spasms. 10/19/22  Yes Willaim Rayas, NP  buPROPion (WELLBUTRIN XL) 150 MG 24 hr tablet Take 1 tablet (150 mg total) by  mouth daily. 04/08/22   Imagene Riches, CNM  fluticasone [facility]) 50 MCG/ACT nasal spray Place 1 spray into both nostrils daily. 06/05/20 07/05/20  Carrie Mew, MD  cetirizine (ZYRTEC) 10 MG tablet Take 1 tablet (10 mg total) by mouth daily. 01/11/19 09/22/20  Cuthriell, Charline Bills, PA-C    Allergies Shellfish allergy, Metronidazole, and Pork-derived products  Family History  Problem Relation Age of Onset   Breast cancer Maternal Grandmother 60   AAA (abdominal aortic aneurysm) Maternal Grandmother    Diabetes Maternal Grandmother    Heart disease Maternal Grandfather    Breast cancer Cousin 80   Hypertension Mother    Hypertension Father    Breast cancer Cousin 66   Heart failure Other    Other Half-Brother        blood disorder "runs thin"    Social History Social History   Tobacco Use   Smoking status: Every Day    Packs/day: 0.75    Types: Cigarettes, E-cigarettes, Cigars    Passive exposure: Current   Smokeless tobacco: Never  Vaping Use   Vaping Use: Never used  Substance Use Topics   Alcohol use: Yes    Alcohol/week: 3.0 standard drinks of alcohol    Types: 3 Shots of liquor per week    Comment: q weekend--3 shots Tequila   Drug use: Not Currently  Types: Marijuana    Comment: last use age 24    Review of Systems  Constitutional: No fever/chills Eyes: No visual changes. ENT: No sore throat. Cardiovascular: Denies chest pain. Respiratory: Denies shortness of breath. Gastrointestinal: No abdominal pain.  No nausea, no vomiting.  No diarrhea.  No constipation. Genitourinary: Negative for dysuria. Musculoskeletal: Lower back pain Skin: Negative for rash. Neurological: Negative for headaches, focal weakness or numbness.   ____________________________________________   PHYSICAL EXAM:  VITAL SIGNS: ED Triage Vitals  Enc Vitals Group     BP 10/19/22 1649 (!) 146/99     Pulse Rate 10/19/22 1649 79     Resp 10/19/22 1649 17     Temp 10/19/22  1649 98.3 F (36.8 C)     Temp src --      SpO2 10/19/22 1649 97 %     Weight 10/19/22 1647 168 lb (76.2 kg)     Height 10/19/22 1647 5' (1.524 m)     Head Circumference --      Peak Flow --      Pain Score 10/19/22 1647 8     Pain Loc --      Pain Edu? --      Excl. in [place]? --     Constitutional: Alert and oriented. Well appearing and in no acute distress. Eyes: Conjunctivae are normal. PERRL. EOMI. Head: Atraumatic. Nose: No congestion/rhinnorhea. Mouth/Throat: Mucous membranes are moist.  Oropharynx non-erythematous. Neck: No stridor.   Cardiovascular: Normal rate, regular rhythm. Grossly normal heart sounds.  Good peripheral circulation. Respiratory: Normal respiratory effort.  No retractions. Lungs CTAB. Gastrointestinal: Soft and nontender. No distention. No abdominal bruits. No CVA tenderness. Musculoskeletal: Patient complains of lower back pain predominantly on the right.  There is no radiation of pain into the hip or leg.  There is no numbness or tingling into extremities.  Patient denies any injury/fall/heavy lifting.  Patient has full range of motion without difficulty. Neurologic:  Normal speech and language. No gross focal neurologic deficits are appreciated. No gait instability. Skin:  Skin is warm, dry and intact. No rash noted. Psychiatric: Mood and affect are normal. Speech and behavior are normal.  ____________________________________________   LABS (all labs ordered are listed, but only abnormal results are displayed)  Labs Reviewed  URINALYSIS, ROUTINE W REFLEX MICROSCOPIC - Abnormal; Notable for the following components:      Result Value   Color, Urine YELLOW (*)    APPearance CLEAR (*)    Hgb urine dipstick LARGE (*)    All other components within normal limits  PREGNANCY, URINE   ____________________________________________  EKG   ____________________________________________  RADIOLOGY  ED MD interpretation:    Official radiology  report(s): No results found.  ____________________________________________   PROCEDURES  Procedure(s) performed: None  Procedures  Critical Care performed: No  ____________________________________________   INITIAL IMPRESSION / ASSESSMENT AND PLAN / ED COURSE     Jean Mckay is a 37 y.o. female Libby Maw to the emergency room for complaint of lower back pain, predominantly on the right.  Patient reports that 3 to 4 days ago she started with lower back pain.  She denies any injury or falls.  She denies any heavy lifting.  Patient reports that the only thing she can associate with the pain is that her menses was starting.  However, this does not appear to be menstrual cramping. Patient reports that she has taken Tylenol and ibuprofen for the pain with no relief.  She reports today that  pain is a 3 out of 10.  She appears in no acute distress at this time.   Will obtain UA. UA unremarkable other than large hemoglobin.  However, patient is on her menses.  Will discharge patient with short course of Flexeril for what could be muscular pain/spasms. She should follow-up with her primary care provider if symptoms persist or worsen. Will discharge home in stable condition at this time.     ____________________________________________   FINAL CLINICAL IMPRESSION(S) / ED DIAGNOSES  Final diagnoses:  Acute right-sided low back pain without sciatica     ED Discharge Orders          Ordered    cyclobenzaprine (FLEXERIL) 5 MG tablet  3 times daily PRN        10/19/22 1809             Note:  This document was prepared using Dragon voice recognition software and may include unintentional dictation errors.     Willaim Rayas, NP 10/19/22 Megan Salon    Duffy Bruce, MD 10/20/22 [phone]

## 2022-10-24 ENCOUNTER — Ambulatory Visit (INDEPENDENT_AMBULATORY_CARE_PROVIDER_SITE_OTHER): Payer: 59

## 2022-10-24 ENCOUNTER — Other Ambulatory Visit (HOSPITAL_COMMUNITY)
Admission: RE | Admit: 2022-10-24 | Discharge: 2022-10-24 | Disposition: A | Discharge status: Home / Self Care | Payer: 59 | Source: Ambulatory Visit | Attending: Licensed Practical Nurse | Admitting: Licensed Practical Nurse

## 2022-10-24 VITALS — BP 130/90 | Ht 60.0 in | Wt 170.0 lb

## 2022-10-24 DIAGNOSIS — Z113 Encounter for screening for infections with a predominantly sexual mode of transmission: Secondary | ICD-10-CM | POA: Diagnosis not present

## 2022-10-24 NOTE — Progress Notes (Signed)
    NURSE VISIT NOTE  Subjective:    Patient ID: Jean Mckay, female    DOB: 06/20/1986, 37 y.o.   MRN: [id number]  HPI  Patient is a 37 y.o. G63P3003 female who presents for std screening. No vaginal discharge. She reports back pain. She had unprotected sex and wants to be tested. Denies abnormal vaginal bleeding or significant pelvic pain or fever. denies dysuria, hematuria, urinary frequency, urinary urgency, flank pain, abdominal pain, pelvic pain, cloudy malordorous urine, genital rash, genital irritation, and vaginal discharge. Patient denies history of known exposure to STD.   Objective:    BP (!) 130/90   Ht 5' (1.524 m)   Wt 170 lb (77.1 kg)   LMP 10/16/2022   BMI 33.20 kg/m    @THIS  VISIT ONLY@  Assessment:   No diagnosis found.    Plan:   GC and chlamydia & Trichomoniasis DNA  probe sent to lab. Treatment: abstain from intercourse until results are back ROV prn if symptoms persist or worsen.   Quintella Baton, CMA

## 2022-10-24 NOTE — Patient Instructions (Signed)
Chlamydia, Female Chlamydia is a sexually transmitted infection (STI). This infection spreads through sexual contact. The infection can grow in: The urethra. This is the part of the body that drains pee (urine) from the bladder. The cervix. This is the lowest part of the womb (uterus). The throat. The opening of the butt (rectum). This condition is not hard to treat. But if it is not treated, it can cause worse health problems. You may have a higher risk of not being able to have children. Also, if you are pregnant or get pregnant and have untreated chlamydia: It can cause serious problems during pregnancy. It can spread to your baby during delivery and cause your baby to have health problems. What are the causes?  This condition is caused by a germ (bacteria) called Chlamydia trachomatis. These germs are spread from an infected partner during sex. The infection can spread through contact with the genitals, mouth, or the opening of the butt (rectum). What increases the risk? Not using a condom the right way. Not using a condom every time you have sex. Having a new sex partner. Having more than one sex partner. Being sexually active before age 27. What are the signs or symptoms? In some cases, there are no symptoms, especially early in the illness. If you get symptoms, they may include: Peeing often, or a burning feeling when you pee. Redness, soreness, or swelling of the vagina or butt. Fluid (discharge) coming from the vagina or butt. Pain the belly (abdomen). Pain during sex. Bleeding between monthly periods or irregular periods. How is this treated? This condition is treated with antibiotic medicines. Follow these instructions at home: Sexual activity Tell your sex partner or partners about your infection. Sex partners are people you had oral, anal, or vaginal sex with within 60 days of when you started getting sick. They need treatment even if they do not feel or seem sick. Do  not have sex until: You and your sex partners have been treated. Your doctor says it is okay. If you get just one dose of medicine, wait at least 7 days before having sex. General instructions Take over-the-counter and prescription medicines as told by your doctor. Finish your antibiotics even if you start to feel better. It is up to you to get your test results. Ask how to get your results when they are ready. Keep all follow-up visits. You may need tests after 3 months. How is this prevented? To lower your risk: Use latex or polyurethane condoms the right way. Do this every time you have sex. Do not have many sex partners. Ask if your sex partner got tested for STIs and had negative results. Get regular health screenings to check for STIs. Contact a doctor if: You get new symptoms. Your symptoms are getting worse or do not get better with treatment. You have a fever or chills. You have pain during sex. Your periods are irregular. You bleed between periods or after sex. You get flu-like symptoms. These may be: Night sweats. Sore throat. Muscle aches. You are unable to take your antibiotic medicine as prescribed. Summary Chlamydia is an infection that spreads through sexual contact. This condition is treated with antibiotics. If it is not treated, it can cause health problems. Your sex partners will also need to be treated. Do not have sex until both you and your partner have been treated. Take all medicines as told and keep all follow-up visits. This information is not intended to replace advice given to you  by your health care provider. Make sure you discuss any questions you have with your health care provider. Document Revised: 06/10/2021 Document Reviewed: 06/10/2021 Elsevier Patient Education  [place].

## 2022-10-29 ENCOUNTER — Telehealth: Payer: Self-pay

## 2022-10-29 LAB — CERVICOVAGINAL ANCILLARY ONLY
Chlamydia: NEGATIVE
Comment: NEGATIVE
Comment: NEGATIVE
Comment: NORMAL
Neisseria Gonorrhea: NEGATIVE
Trichomonas: NEGATIVE

## 2022-10-29 NOTE — Telephone Encounter (Signed)
Called pt, she's aware of results. All normal . Negative for any std.

## 2022-11-07 ENCOUNTER — Ambulatory Visit: Payer: 59 | Admitting: Licensed Practical Nurse

## 2023-02-24 ENCOUNTER — Other Ambulatory Visit: Payer: Self-pay

## 2023-02-24 ENCOUNTER — Encounter: Payer: Self-pay | Admitting: Emergency Medicine

## 2023-02-24 ENCOUNTER — Emergency Department: Payer: 59

## 2023-02-24 ENCOUNTER — Emergency Department
Admission: EM | Admit: 2023-02-24 | Discharge: 2023-02-24 | Disposition: A | Discharge status: Home / Self Care | Payer: 59 | Attending: Emergency Medicine | Admitting: Emergency Medicine

## 2023-02-24 DIAGNOSIS — D251 Intramural leiomyoma of uterus: Secondary | ICD-10-CM | POA: Diagnosis not present

## 2023-02-24 DIAGNOSIS — Z87891 Personal history of nicotine dependence: Secondary | ICD-10-CM | POA: Diagnosis not present

## 2023-02-24 DIAGNOSIS — D250 Submucous leiomyoma of uterus: Secondary | ICD-10-CM

## 2023-02-24 DIAGNOSIS — R109 Unspecified abdominal pain: Secondary | ICD-10-CM | POA: Diagnosis present

## 2023-02-24 DIAGNOSIS — E876 Hypokalemia: Secondary | ICD-10-CM | POA: Diagnosis not present

## 2023-02-24 LAB — URINALYSIS, ROUTINE W REFLEX MICROSCOPIC
Bilirubin Urine: NEGATIVE
Glucose, UA: NEGATIVE mg/dL
Ketones, ur: NEGATIVE mg/dL
Leukocytes,Ua: NEGATIVE
Nitrite: NEGATIVE
Protein, ur: 100 mg/dL — AB
RBC / HPF: >50 RBC/hpf (ref 0–5)
Specific Gravity, Urine: 1.01 (ref 1.005–1.030)
pH: 6 (ref 5.0–8.0)

## 2023-02-24 LAB — CBC
HCT: 36.5 % (ref 36.0–46.0)
Hemoglobin: 11.7 g/dL — ABNORMAL LOW (ref 12.0–15.0)
MCH: 28.7 pg (ref 26.0–34.0)
MCHC: 32.1 g/dL (ref 30.0–36.0)
MCV: 89.5 fL (ref 80.0–100.0)
Platelets: 251 10*3/uL (ref 150–400)
RBC: 4.08 MIL/uL (ref 3.87–5.11)
RDW: 12.8 % (ref 11.5–15.5)
WBC: 6.5 10*3/uL (ref 4.0–10.5)
nRBC: 0 % (ref 0.0–0.2)

## 2023-02-24 LAB — COMPREHENSIVE METABOLIC PANEL
ALT: 20 U/L (ref 0–44)
AST: 24 U/L (ref 15–41)
Albumin: 4.1 g/dL (ref 3.5–5.0)
Alkaline Phosphatase: 55 U/L (ref 38–126)
Anion gap: 9 (ref 5–15)
BUN: 8 mg/dL (ref 6–20)
CO2: 21 mmol/L — ABNORMAL LOW (ref 22–32)
Calcium: 8.8 mg/dL — ABNORMAL LOW (ref 8.9–10.3)
Chloride: 106 mmol/L (ref 98–111)
Creatinine, Ser: 0.61 mg/dL (ref 0.44–1.00)
GFR, Estimated: >60 mL/min (ref >60)
Glucose, Bld: 126 mg/dL — ABNORMAL HIGH (ref 70–99)
Potassium: 2.8 mmol/L — ABNORMAL LOW (ref 3.5–5.1)
Sodium: 136 mmol/L (ref 135–145)
Total Bilirubin: 0.9 mg/dL (ref 0.3–1.2)
Total Protein: 7.4 g/dL (ref 6.5–8.1)

## 2023-02-24 LAB — POC URINE PREG, ED: Preg Test, Ur: NEGATIVE

## 2023-02-24 LAB — LIPASE, BLOOD: Lipase: 32 U/L (ref 11–51)

## 2023-02-24 MED ORDER — IOHEXOL 300 MG/ML  SOLN
100.0000 mL | Freq: Once | INTRAMUSCULAR | Status: AC | PRN
  Administered 2023-02-24: 100 mL via INTRAVENOUS

## 2023-02-24 MED ORDER — POTASSIUM CHLORIDE CRYS ER 20 MEQ PO TBCR
40.0000 meq | EXTENDED_RELEASE_TABLET | Freq: Once | ORAL | Status: AC
  Administered 2023-02-24: 40 meq via ORAL
  Filled 2023-02-24: qty 2

## 2023-02-24 MED ORDER — NAPROXEN 500 MG PO TABS: 500.0000 mg | ORAL_TABLET | Freq: Two times a day (BID) | ORAL | 0 refills | Status: AC

## 2023-02-24 NOTE — ED Triage Notes (Signed)
Pt sts that she has been having abd pain and cramping. Pt sts that it feels like its in her pelvic region. Pt sts that she has not been having any vaginal discharge, bleeding or odor. Pt sts that when she does have the abd pain she has to go and have a BM.

## 2023-02-24 NOTE — ED Provider Notes (Signed)
[facility] Provider Note    Event Date/Time   First MD Initiated Contact with Patient 02/24/23 1222     (approximate)   History   Abdominal Pain   HPI  Jean Mckay is a 37 y.o. female who presents today for evaluation of.  She describes the cramping.  She reports that this is normal and the pain that she gets when she has menstrual cramps, however she reports that she is not currently on her menstrual cycle.  She has reports that she has low back pain.  She has not noticed any vaginal discharge.  She has noticed blood in her urine.  She also has had vaginal spotting.  She has not had any burning with urination.  No history of kidney stones.  She reports that when she gets cramps in her abdomen it causes her to have diarrhea.  She has not had any vomiting.  No blood in her stool.  Patient Active Problem List   Diagnosis Date Noted   Anxiety 04/29/2022   Hx of postpartum depression 2007 06/03/2019   Smoker 5-15 cpd 06/03/2019          Physical Exam   Triage Vital Signs: ED Triage Vitals  Enc Vitals Group     BP 02/24/23 1120 (!) 159/103     Pulse Rate 02/24/23 1120 73     Resp 02/24/23 1120 18     Temp 02/24/23 1120 98.4 F (36.9 C)     Temp Source 02/24/23 1120 Oral     SpO2 02/24/23 1120 98 %     Weight 02/24/23 1121 170 lb (77.1 kg)     Height --      Head Circumference --      Peak Flow --      Pain Score 02/24/23 1121 9     Pain Loc --      Pain Edu? --      Excl. in GC? --     Most recent vital signs: Vitals:   02/24/23 1120 02/24/23 1627  BP: (!) 159/103 (!) 150/90  Pulse: 73 70  Resp: 18 18  Temp: 98.4 F (36.9 C) 98 F (36.7 C)  SpO2: 98% 98%    Physical Exam Vitals and nursing note reviewed.  Constitutional:      General: Awake and alert. No acute distress.    Appearance: Normal appearance. The patient is obese.  HENT:     Head: Normocephalic and atraumatic.     Mouth: Mucous membranes are moist.   Eyes:     General: PERRL. Normal EOMs        Right eye: No discharge.        Left eye: No discharge.     Conjunctiva/sclera: Conjunctivae normal.  Cardiovascular:     Rate and Rhythm: Normal rate and regular rhythm.     Pulses: Normal pulses.     Heart sounds: Normal heart sounds Pulmonary:     Effort: Pulmonary effort is normal. No respiratory distress.     Breath sounds: Normal breath sounds.  Abdominal:     Abdomen is soft. There is no abdominal tenderness. No rebound or guarding. No distention. No CVAT Musculoskeletal:        General: No swelling. Normal range of motion.     Cervical back: Normal range of motion and neck supple.  Skin:    General: Skin is warm and dry.     Capillary Refill: Capillary refill takes less than 2 seconds.  Findings: No rash.  Neurological:     Mental Status: The patient is awake and alert.      ED Results / Procedures / Treatments   Labs (all labs ordered are listed, but only abnormal results are displayed) Labs Reviewed  COMPREHENSIVE METABOLIC PANEL - Abnormal; Notable for the following components:      Result Value   Potassium 2.8 (*)    CO2 21 (*)    Glucose, Bld 126 (*)    Calcium 8.8 (*)    All other components within normal limits  CBC - Abnormal; Notable for the following components:   Hemoglobin 11.7 (*)    All other components within normal limits  URINALYSIS, ROUTINE W REFLEX MICROSCOPIC - Abnormal; Notable for the following components:   Color, Urine RED (*)    APPearance HAZY (*)    Hgb urine dipstick LARGE (*)    Protein, ur 100 (*)    Bacteria, UA RARE (*)    All other components within normal limits  LIPASE, BLOOD  POC URINE PREG, ED     EKG     RADIOLOGY I independently reviewed and interpreted imaging and agree with radiologists findings.     PROCEDURES:  Critical Care performed:   Procedures   MEDICATIONS ORDERED IN ED: Medications  potassium chloride SA (KLOR-CON M) CR tablet 40 mEq (40  mEq Oral Given 02/24/23 1305)  iohexol (OMNIPAQUE) 300 MG/ML solution 100 mL (100 mLs Intravenous Contrast Given 02/24/23 1431)     IMPRESSION / MDM / ASSESSMENT AND PLAN / ED COURSE  I reviewed the triage vital signs and the nursing notes.   Differential diagnosis includes, but is not limited to, gastroenteritis, fibroids, ovarian cyst, endometriosis, nephrolithiasis.  Patient is awake and alert, hemodynamically stable and afebrile.  She has no reproducible abdominal tenderness.  Labs reveal normal white blood cell count, normal H&H, though she was found to have a low potassium to 2.8.  This was repleted with oral potassium.  She is also noted to have greater than 50 RBCs in her urine.  Patient does note that she has had some vaginal spotting.  Ultrasound reveals a fibroid uterus and her CT scan corroborates this.  No other acute process noted in the abdomen or pelvis.  She was instructed to follow-up with OB/GYN and the appropriate follow-up information was provided given that she reports that she does not currently have an OB/GYN.  We did discuss strict return precautions in the meantime.  She was given naproxen for analgesia given that she is not pregnant.  We discussed return precautions and the importance of close outpatient follow-up.  Patient understands and agrees with plan.  She was discharged in stable condition.   Patient's presentation is most consistent with acute complicated illness / injury requiring diagnostic workup.    FINAL CLINICAL IMPRESSION(S) / ED DIAGNOSES   Final diagnoses:  Intramural and submucous leiomyoma of uterus  Hypokalemia     Rx / DC Orders   ED Discharge Orders          Ordered    naproxen (NAPROSYN) 500 MG tablet  2 times daily with meals        02/24/23 1618             Note:  This document was prepared using Dragon voice recognition software and may include unintentional dictation errors.   Keturah Shavers 02/24/23 1630     Trinna Post, MD 02/24/23 [phone]

## 2023-02-24 NOTE — Discharge Instructions (Signed)
Your CAT scan and ultrasound revealed fibroids.  This may be contributing to your symptoms.  Please follow-up with OB/GYN for further management of this.  Please return for any new, worsening, or change in symptoms or other concerns.  It was a pleasure caring for you today

## 2023-03-03 ENCOUNTER — Ambulatory Visit: Payer: 59 | Admitting: Family Medicine

## 2023-03-03 ENCOUNTER — Encounter: Payer: Self-pay | Admitting: Family Medicine

## 2023-03-03 VITALS — BP 124/90 | HR 64 | Ht 60.0 in | Wt 168.0 lb

## 2023-03-03 DIAGNOSIS — F418 Other specified anxiety disorders: Secondary | ICD-10-CM | POA: Diagnosis not present

## 2023-03-03 DIAGNOSIS — D649 Anemia, unspecified: Secondary | ICD-10-CM

## 2023-03-03 DIAGNOSIS — Z13220 Encounter for screening for lipoid disorders: Secondary | ICD-10-CM

## 2023-03-03 DIAGNOSIS — R102 Pelvic and perineal pain unspecified side: Secondary | ICD-10-CM

## 2023-03-03 DIAGNOSIS — R7301 Impaired fasting glucose: Secondary | ICD-10-CM

## 2023-03-03 DIAGNOSIS — F17200 Nicotine dependence, unspecified, uncomplicated: Secondary | ICD-10-CM

## 2023-03-03 DIAGNOSIS — E559 Vitamin D deficiency, unspecified: Secondary | ICD-10-CM

## 2023-03-03 DIAGNOSIS — Z1159 Encounter for screening for other viral diseases: Secondary | ICD-10-CM

## 2023-03-03 DIAGNOSIS — I10 Essential (primary) hypertension: Secondary | ICD-10-CM | POA: Diagnosis not present

## 2023-03-03 DIAGNOSIS — E876 Hypokalemia: Secondary | ICD-10-CM

## 2023-03-03 DIAGNOSIS — Z114 Encounter for screening for human immunodeficiency virus [HIV]: Secondary | ICD-10-CM

## 2023-03-03 MED ORDER — SERTRALINE HCL 25 MG PO TABS: 25.0000 mg | ORAL_TABLET | Freq: Every day | ORAL | 3 refills | Status: DC

## 2023-03-03 MED ORDER — HYDROXYZINE PAMOATE 25 MG PO CAPS: 25.0000 mg | ORAL_CAPSULE | Freq: Three times a day (TID) | ORAL | 0 refills | Status: DC | PRN

## 2023-03-03 NOTE — Assessment & Plan Note (Signed)
>>  ASSESSMENT AND PLAN FOR ANXIETY WRITTEN ON 03/03/2023 12:00 PM BY Aislin Onofre, Ocie Bob, MD  Chronic, in the setting of prior postpartum depression and previous Wellbutrin dosing (compliance issues reported).  Having weekly "anxiety attacks ", multiple life stressors reported.  PHQ and GAD scores reviewed as well as treatment strategies.  Shared medical decision making regarding treatments.  Plan as follows: - Initiate sertraline 25 mg daily - As needed hydroxyzine 25-50 mg - Close follow-up in 1 month at CPE, can discuss modification/titration

## 2023-03-03 NOTE — Assessment & Plan Note (Signed)
Chronic, in the setting of prior postpartum depression and previous Wellbutrin dosing (compliance issues reported).  Having weekly "anxiety attacks ", multiple life stressors reported.  PHQ and GAD scores reviewed as well as treatment strategies.  Shared medical decision making regarding treatments.  Plan as follows: - Initiate sertraline 25 mg daily - As needed hydroxyzine 25-50 mg - Close follow-up in 1 month at CPE, can discuss modification/titration

## 2023-03-03 NOTE — Assessment & Plan Note (Signed)
Instantly noted on recent labs at [facility] on 6/11, oral replacement at that time.  -Recheck labs and follow results accordingly

## 2023-03-03 NOTE — Assessment & Plan Note (Signed)
Noted on prior record review in the setting of comorbid anxiety and currently in pain secondary to uterine fibroids.  Has had intermittent headaches but otherwise denies cardiopulmonary complaints.  No prior medications for this issue.  Examination without JVD, positive S1-S2, regular rate and rhythm, no additional heart sounds appreciated, clear lung fields auscultated without wheezes, rales, rhonchi  Plan as follows: - Order risk stratification labs - Close follow-up in 1 month to initiate treatment pending labs and readings at return

## 2023-03-03 NOTE — Assessment & Plan Note (Signed)
In the setting of uterine fibroids, acute on chronic, is established with OB/GYN and sees them tomorrow.  Will follow peripherally on this issue.

## 2023-03-03 NOTE — Patient Instructions (Addendum)
-   Obtain labs today - Review information attached - Start sertraline daily - Can use hydroxyzine, 1-2 capsules as needed for anxiety attacks, side effect can be drowsiness - Maintain follow-up with gynecology tomorrow - Return for annual physical in 1 month

## 2023-03-03 NOTE — Assessment & Plan Note (Signed)
Discussed current ongoing smoking, not amenable to cessation at this time.  Will revisit at physical.

## 2023-03-03 NOTE — Progress Notes (Signed)
Primary Care / Sports Medicine Office Visit  Patient Information:  Patient ID: Quisha Duplantis, female DOB: November 14, 1985 Age: 37 y.o. MRN: [id number]   Lilyani Labra is a pleasant 37 y.o. female presenting with the following:  Chief Complaint  Patient presents with   Establish Care    Has fibroids that are painful, wants FMLA for high Blood pressure, Has GYN appointment tomorrow     Vitals:   03/03/23 1027 03/03/23 1038  BP: (!) 110/90 (!) 124/90  Pulse: 64   SpO2: 98%    Vitals:   03/03/23 1027  Weight: 168 lb (76.2 kg)  Height: 5' (1.524 m)   Body mass index is 32.81 kg/m.     Independent interpretation of notes and tests performed by another provider:   None  Procedures performed:   None  Pertinent History, Exam, Impression, and Recommendations:   Morticia was seen today for establish care.  Primary hypertension Assessment & Plan: Noted on prior record review in the setting of comorbid anxiety and currently in pain secondary to uterine fibroids.  Has had intermittent headaches but otherwise denies cardiopulmonary complaints.  No prior medications for this issue.  Examination without JVD, positive S1-S2, regular rate and rhythm, no additional heart sounds appreciated, clear lung fields auscultated without wheezes, rales, rhonchi  Plan as follows: - Order risk stratification labs - Close follow-up in 1 month to initiate treatment pending labs and readings at return  Orders: -     Comprehensive metabolic panel -     Lipid panel  Anemia, unspecified type Overview: Previous pregnancy  Orders: -     CBC -     Iron, TIBC and Ferritin Panel  Anxiety with depression Overview: Wellbutrin 150 mg -describes sporadic compliance, made anxiety worse  Assessment & Plan: Chronic, in the setting of prior postpartum depression and previous Wellbutrin dosing (compliance issues reported).  Having weekly "anxiety attacks ", multiple life  stressors reported.  PHQ and GAD scores reviewed as well as treatment strategies.  Shared medical decision making regarding treatments.  Plan as follows: - Initiate sertraline 25 mg daily - As needed hydroxyzine 25-50 mg - Close follow-up in 1 month at CPE, can discuss modification/titration  Orders: -     TSH -     Sertraline HCl; Take 1 tablet (25 mg total) by mouth daily.  Dispense: 30 tablet; Refill: 3 -     hydrOXYzine Pamoate; Take 1-2 capsules (25-50 mg total) by mouth every 8 (eight) hours as needed for anxiety.  Dispense: 30 capsule; Refill: 0  Elevated fasting glucose -     Comprehensive metabolic panel -     Hemoglobin A1c  Need for hepatitis C screening test -     Hepatitis C antibody  Screening for HIV (human immunodeficiency virus) -     HIV Antibody (routine testing w rflx)  Screening for lipoid disorders -     Comprehensive metabolic panel -     Lipid panel  Vitamin D deficiency -     VITAMIN D 25 Hydroxy (Vit-D Deficiency, Fractures)  Smoker 5-15 cpd Overview: Previous treatments: - Wellbutrin, sporadic dosing  Assessment & Plan: Discussed current ongoing smoking, not amenable to cessation at this time.  Will revisit at physical.   Pelvic pain Assessment & Plan: In the setting of uterine fibroids, acute on chronic, is established with OB/GYN and sees them tomorrow.  Will follow peripherally on this issue.   Hypokalemia Assessment & Plan: Instantly noted on recent  labs at [facility] on 6/11, oral replacement at that time.  -Recheck labs and follow results accordingly    I provided a total time of 78 minutes including both face-to-face and non-face-to-face time on 03/03/2023 inclusive of time utilized for medical chart review, information gathering, care coordination with staff, and documentation completion.   Orders & Medications Meds ordered this encounter  Medications   sertraline (ZOLOFT) 25 MG tablet    Sig: Take 1 tablet (25 mg total) by mouth  daily.    Dispense:  30 tablet    Refill:  3   hydrOXYzine (VISTARIL) 25 MG capsule    Sig: Take 1-2 capsules (25-50 mg total) by mouth every 8 (eight) hours as needed for anxiety.    Dispense:  30 capsule    Refill:  0   Orders Placed This Encounter  Procedures   CBC   Comprehensive metabolic panel   Hepatitis C antibody   HIV Antibody (routine testing w rflx)   Lipid panel   TSH   VITAMIN D 25 Hydroxy (Vit-D Deficiency, Fractures)   Hemoglobin A1c   Iron, TIBC and Ferritin Panel     No follow-ups on file.     Jerrol Banana, MD, [facility]   Primary Care Sports Medicine Primary Care and Sports Medicine at [facility]

## 2023-03-04 ENCOUNTER — Other Ambulatory Visit (HOSPITAL_COMMUNITY)
Admission: RE | Admit: 2023-03-04 | Discharge: 2023-03-04 | Disposition: A | Discharge status: Home / Self Care | Payer: 59 | Source: Ambulatory Visit | Attending: Obstetrics | Admitting: Obstetrics

## 2023-03-04 ENCOUNTER — Ambulatory Visit (INDEPENDENT_AMBULATORY_CARE_PROVIDER_SITE_OTHER): Payer: 59 | Admitting: Obstetrics

## 2023-03-04 ENCOUNTER — Encounter: Payer: Self-pay | Admitting: Obstetrics

## 2023-03-04 VITALS — BP 129/90 | HR 83 | Ht 60.0 in | Wt 171.0 lb

## 2023-03-04 DIAGNOSIS — Z124 Encounter for screening for malignant neoplasm of cervix: Secondary | ICD-10-CM

## 2023-03-04 DIAGNOSIS — D250 Submucous leiomyoma of uterus: Secondary | ICD-10-CM | POA: Diagnosis not present

## 2023-03-04 DIAGNOSIS — N924 Excessive bleeding in the premenopausal period: Secondary | ICD-10-CM

## 2023-03-04 DIAGNOSIS — N946 Dysmenorrhea, unspecified: Secondary | ICD-10-CM | POA: Diagnosis not present

## 2023-03-04 MED ORDER — IBUPROFEN 600 MG PO TABS: 600.0000 mg | ORAL_TABLET | Freq: Four times a day (QID) | ORAL | 6 refills | Status: DC | PRN

## 2023-03-04 MED ORDER — NORETHINDRONE 0.35 MG PO TABS: 1.0000 | ORAL_TABLET | Freq: Every day | ORAL | 4 refills | Status: DC

## 2023-03-04 NOTE — Progress Notes (Signed)
Obstetrics & Gynecology Office Visit   Chief Complaint:  Chief Complaint  Patient presents with   Follow-up    Anxiety   Pelvic Pain    U/S done    History of Present Illness: Ms. Pfenninger present today with several concerns. She was seen recently at the ED for pelvic pain, and an ultrasound reveals two uterine fibroids. This new diagnosis scared her and she set up the appointment to discuss options for treatment. She also brings up several other concerns: pelvic pain ( reason for her ED visit that revealed the two fibroids),  dysmenorrhea, and some ongoing issues with anxiety/depression. She was seen yesterday by a new provider to establish care and address some elevated blood pressures.the physician started her on zoloft to address the anxiety. She was previously seen at [facility] for mood issues in 2022, and based upon her concerns and symptoms was placed on Wellbutrin. The plan included this medication to assist with her attempt to quit smoking.  She did not keep a f/u appointment, and today shares that she has not consistantly used the medication. She had some side effects ( sleepiness) that bother her on the Bupropion. She is single, has two children, and is not using any contraception.    Review of Systems:  Review of Systems  Constitutional: Negative.   HENT: Negative.    Eyes: Negative.   Respiratory: Negative.    Cardiovascular: Negative.   Gastrointestinal: Negative.   Genitourinary: Negative.   Musculoskeletal: Negative.   Skin: Negative.   Neurological: Negative.   Endo/Heme/Allergies: Negative.   Psychiatric/Behavioral:  Positive for depression. The patient is nervous/anxious.        PHQ -15 GAD 11     Past Medical History:  Past Medical History:  Diagnosis Date   Anemia    Previous pregnancy   Anxiety    Endometriosis 2012   Gestational diabetes    controlled on diet with G2   Post partum depression 2007    Past Surgical History:  Past Surgical History:   Procedure Laterality Date   CESAREAN SECTION  05/16/2014   FTP   CESAREAN SECTION N/A 06/04/2017   Procedure: CESAREAN SECTION;  Surgeon: Conard Novak, MD;  Location: [facility] ORS;  Service: Obstetrics;  Laterality: N/A;   WISDOM TOOTH EXTRACTION      Gynecologic History: Patient's last menstrual period was 02/25/2023 (approximate).  Obstetric History: [id number]  Family History:  Family History  Problem Relation Age of Onset   Hypertension Mother    Hypertension Father    Osteoarthritis Father    Breast cancer Maternal Grandmother 4   AAA (abdominal aortic aneurysm) Maternal Grandmother    Diabetes Maternal Grandmother    Heart disease Maternal Grandfather    Breast cancer Cousin 40   Breast cancer Cousin 60   Other Half-Brother        blood disorder "runs thin"   Heart failure Other     Social History:  Social History   Socioeconomic History   Marital status: Single    Spouse name: Not on file   Number of children: 2   Years of education: Not on file   Highest education level: Not on file  Occupational History   Occupation: Physicist, medical  Tobacco Use   Smoking status: Every Day    Packs/day: .75    Types: Cigarettes, E-cigarettes, Cigars    Start date: 2005    Passive exposure: Current   Smokeless tobacco: Never  Vaping Use  Vaping Use: Never used  Substance and Sexual Activity   Alcohol use: Yes    Alcohol/week: 3.0 standard drinks of alcohol    Types: 3 Shots of liquor per week    Comment: q weekend--3 shots Tequila   Drug use: Not Currently    Types: Marijuana    Comment: last use age 25   Sexual activity: Not Currently    Partners: Male    Birth control/protection: None  Other Topics Concern   Not on file  Social History Narrative   Not on file   Social Determinants of Health   Financial Resource Strain: Not on file  Food Insecurity: Not on file  Transportation Needs: Not on file  Physical Activity: Not on file  Stress: Not on file   Social Connections: Not on file  Intimate Partner Violence: Not on file    Allergies:  Allergies  Allergen Reactions   Shellfish Allergy Swelling    Feeling tightness and irritation in throat   Metronidazole Rash    June 2015 June 2015   Pork-Derived Products Diarrhea and Rash    Medications: Prior to Admission medications   Medication Sig Start Date End Date Taking? Authorizing Provider  ibuprofen (ADVIL) 600 MG tablet Take 1 tablet (600 mg total) by mouth every 6 (six) hours as needed for cramping. 03/04/23  Yes Mirna Mires, CNM  norethindrone (MICRONOR) 0.35 MG tablet Take 1 tablet (0.35 mg total) by mouth daily. 03/04/23  Yes Mirna Mires, CNM  hydrOXYzine (VISTARIL) 25 MG capsule Take 1-2 capsules (25-50 mg total) by mouth every 8 (eight) hours as needed for anxiety. Patient not taking: Reported on 03/04/2023 03/03/23   Jerrol Banana, MD  sertraline (ZOLOFT) 25 MG tablet Take 1 tablet (25 mg total) by mouth daily. Patient not taking: Reported on 03/04/2023 03/03/23   Jerrol Banana, MD  cetirizine (ZYRTEC) 10 MG tablet Take 1 tablet (10 mg total) by mouth daily. 01/11/19 09/22/20  Cuthriell, Delorise Royals, PA-C    Physical Exam Vitals:  Vitals:   03/04/23 1118  BP: (!) 129/90  Pulse: 83   Patient's last menstrual period was 02/25/2023 (approximate).  Physical Exam Constitutional:      Appearance: Normal appearance. She is obese.  Cardiovascular:     Rate and Rhythm: Normal rate and regular rhythm.     Pulses: Normal pulses.     Heart sounds: Normal heart sounds.  Pulmonary:     Effort: Pulmonary effort is normal.     Breath sounds: Normal breath sounds.  Abdominal:     Palpations: Abdomen is soft.  Genitourinary:    General: Normal vulva.     Rectum: Normal.     Comments: No external lesions or visible discharge Uterus is anteverted, non enlarged. Normal vaginal mucosa Pap smear retrieved today. Nn CMT, she decline STI testing  today. Musculoskeletal:     Cervical back: Normal range of motion and neck supple.  Skin:    General: Skin is warm and dry.  Neurological:     General: No focal deficit present.     Mental Status: She is alert and oriented to person, place, and time.      Assessment: 37 y.o. [id number] No problem-specific Assessment & Plan notes found for this encounter.   Plan: Problem List Items Addressed This Visit       Genitourinary   Fibroids, submucosal   Other Visit Diagnoses     Menorrhagia, premenopausal    -  Primary  Relevant Medications   norethindrone (MICRONOR) 0.35 MG tablet   Cervical cancer screening       Relevant Orders   Cytology - PAP   Dysmenorrhea       Relevant Medications   ibuprofen (ADVIL) 600 MG tablet   norethindrone (MICRONOR) 0.35 MG tablet     Discussed her ultrasound report- two fibroids , one is 3 cm and the other less than 2 cms. She is offered the option of discussing Myomectomy with Dr. Valentino Saxon.  In the meantime, Dysmenorrhea- Will start on Micronor (37 and a smoker, so no Estrogen) and I have prescribed Motrin 600 mg, which she will start on the first day of her period and take q 6 hours for the first day.  We discussed that the Motrin and staying on the Micronor may alleviate her pelvic discomfort. Follow up on te fibroids will likely be based on whether the Micronor and Motrin will alleviate her dysmenorrhea concerns.  Mixed anxiety and depressive SXS- Has been addressed by her new PCP. I have encouraged her to continue this medication, and discuss her response with the PCP.  I did a pap smear today, as she is over due. F/U PRN  Mirna Mires, CNM  03/04/2023 1:08 PM

## 2023-03-05 LAB — CYTOLOGY - PAP
Comment: NEGATIVE
Diagnosis: NEGATIVE
High risk HPV: NEGATIVE

## 2023-03-14 LAB — CBC
Hematocrit: 34.3 % (ref 34.0–46.6)
Hemoglobin: 11.6 g/dL (ref 11.1–15.9)
MCH: 28.9 pg (ref 26.6–33.0)
MCHC: 33.8 g/dL (ref 31.5–35.7)
MCV: 86 fL (ref 79–97)
Platelets: 295 10*3/uL (ref 150–450)
RBC: 4.01 x10E6/uL (ref 3.77–5.28)
RDW: 12.7 % (ref 11.7–15.4)
WBC: 6.9 10*3/uL (ref 3.4–10.8)

## 2023-03-14 LAB — COMPREHENSIVE METABOLIC PANEL
ALT: 19 IU/L (ref 0–32)
AST: 18 IU/L (ref 0–40)
Albumin: 4.3 g/dL (ref 3.9–4.9)
Alkaline Phosphatase: 65 IU/L (ref 44–121)
BUN/Creatinine Ratio: 19 (ref 9–23)
BUN: 10 mg/dL (ref 6–20)
Bilirubin Total: 0.4 mg/dL (ref 0.0–1.2)
CO2: 20 mmol/L (ref 20–29)
Calcium: 9 mg/dL (ref 8.7–10.2)
Chloride: 106 mmol/L (ref 96–106)
Creatinine, Ser: 0.54 mg/dL — ABNORMAL LOW (ref 0.57–1.00)
Globulin, Total: 2.7 g/dL (ref 1.5–4.5)
Glucose: 94 mg/dL (ref 70–99)
Potassium: 3.4 mmol/L — ABNORMAL LOW (ref 3.5–5.2)
Sodium: 141 mmol/L (ref 134–144)
Total Protein: 7 g/dL (ref 6.0–8.5)
eGFR: 123 mL/min/{1.73_m2} (ref >59)

## 2023-03-14 LAB — LIPID PANEL
Chol/HDL Ratio: 3.4 ratio (ref 0.0–4.4)
Cholesterol, Total: 130 mg/dL (ref 100–199)
HDL: 38 mg/dL — ABNORMAL LOW (ref >39)
LDL Chol Calc (NIH): 64 mg/dL (ref 0–99)
Triglycerides: 162 mg/dL — ABNORMAL HIGH (ref 0–149)
VLDL Cholesterol Cal: 28 mg/dL (ref 5–40)

## 2023-03-14 LAB — IRON,TIBC AND FERRITIN PANEL
Ferritin: 96 ng/mL (ref 15–150)
Iron Saturation: 11 % — ABNORMAL LOW (ref 15–55)
Iron: 45 ug/dL (ref 27–159)
Total Iron Binding Capacity: 398 ug/dL (ref 250–450)
UIBC: 353 ug/dL (ref 131–425)

## 2023-03-14 LAB — HEMOGLOBIN A1C
Est. average glucose Bld gHb Est-mCnc: 97 mg/dL
Hgb A1c MFr Bld: 5 % (ref 4.8–5.6)

## 2023-03-14 LAB — VITAMIN D 25 HYDROXY (VIT D DEFICIENCY, FRACTURES): Vit D, 25-Hydroxy: 16.5 ng/mL — ABNORMAL LOW (ref 30.0–100.0)

## 2023-03-14 LAB — HIV ANTIBODY (ROUTINE TESTING W REFLEX): HIV Screen 4th Generation wRfx: NONREACTIVE

## 2023-03-14 LAB — HEPATITIS C ANTIBODY: Hep C Virus Ab: NONREACTIVE

## 2023-03-14 LAB — TSH: TSH: 0.619 u[IU]/mL (ref 0.450–4.500)

## 2023-03-18 ENCOUNTER — Encounter: Payer: Self-pay | Admitting: Family Medicine

## 2023-03-18 ENCOUNTER — Other Ambulatory Visit: Payer: Self-pay | Admitting: Family Medicine

## 2023-03-18 MED ORDER — VITAMIN D (ERGOCALCIFEROL) 1.25 MG (50000 UNIT) PO CAPS: 50000.0000 [IU] | ORAL_CAPSULE | ORAL | 0 refills | Status: DC

## 2023-03-29 ENCOUNTER — Other Ambulatory Visit: Payer: Self-pay

## 2023-03-29 ENCOUNTER — Emergency Department
Admission: EM | Admit: 2023-03-29 | Discharge: 2023-03-29 | Disposition: A | Discharge status: Home / Self Care | Payer: 59 | Attending: Student | Admitting: Student

## 2023-03-29 DIAGNOSIS — L97519 Non-pressure chronic ulcer of other part of right foot with unspecified severity: Secondary | ICD-10-CM | POA: Diagnosis not present

## 2023-03-29 DIAGNOSIS — S91101A Unspecified open wound of right great toe without damage to nail, initial encounter: Secondary | ICD-10-CM | POA: Insufficient documentation

## 2023-03-29 DIAGNOSIS — X58XXXA Exposure to other specified factors, initial encounter: Secondary | ICD-10-CM | POA: Insufficient documentation

## 2023-03-29 DIAGNOSIS — L089 Local infection of the skin and subcutaneous tissue, unspecified: Secondary | ICD-10-CM | POA: Insufficient documentation

## 2023-03-29 MED ORDER — CEPHALEXIN 500 MG PO CAPS: 500.0000 mg | ORAL_CAPSULE | Freq: Four times a day (QID) | ORAL | 0 refills | Status: AC

## 2023-03-29 NOTE — ED Triage Notes (Signed)
Pt reports fell a week ago and scraped her big toe on her right foot. Pt reports toe is still painful and swollen. No LOC with fall

## 2023-03-29 NOTE — Discharge Instructions (Signed)
Apply triple antibiotic cream to the wound and cover with a bandage. Wash and change dressing daily.   Take the antibiotic every 6 hours for 5 days.   You can apply lidocaine gel to your tow for pain. You can take tylenol and ibuprofen as needed for pain.

## 2023-03-29 NOTE — ED Notes (Signed)
Pt verbalizes understanding of d/c instructions, medications and follow up 

## 2023-03-29 NOTE — ED Provider Notes (Signed)
[facility] Provider Note    None    (approximate)   History   Fall and Toe Injury   HPI  Jean Mckay is a 37 y.o. female with PMH of anxiety and gestational diabetes presents for evaluation of a scrape on her right big toe that occurred on 03/20/2023 and is not healing.  Patient reports pain to the big toe and some difficulty walking.        Physical Exam   Triage Vital Signs: ED Triage Vitals  Encounter Vitals Group     BP 03/29/23 1505 (!) 162/99     Systolic BP Percentile --      Diastolic BP Percentile --      Pulse Rate 03/29/23 1505 89     Resp 03/29/23 1505 16     Temp 03/29/23 1505 98.8 F (37.1 C)     Temp Source 03/29/23 1505 Oral     SpO2 03/29/23 1505 98 %     Weight 03/29/23 1458 171 lb 15.3 oz (78 kg)     Height 03/29/23 1458 5' (1.524 m)     Head Circumference --      Peak Flow --      Pain Score 03/29/23 1458 8     Pain Loc --      Pain Education --      Exclude from Growth Chart --     Most recent vital signs: Vitals:   03/29/23 1505  BP: (!) 162/99  Pulse: 89  Resp: 16  Temp: 98.8 F (37.1 C)  SpO2: 98%     General: Awake, no distress.  I just added CV:  Good peripheral perfusion.  Resp:  Normal effort.  Abd:  No distention.  Right remembered Other:  1 cm ulceration to the medial big toe, no bleeding.  ROM of toe maintained.  Tender to palpation.  Erythematous, warm and swollen.   ED Results / Procedures / Treatments   Labs (all labs ordered are listed, but only abnormal results are displayed) Labs Reviewed - No data to display   PROCEDURES:  Critical Care performed: No  Procedures   MEDICATIONS ORDERED IN ED: Medications - No data to display   IMPRESSION / MDM / ASSESSMENT AND PLAN / ED COURSE  I reviewed the triage vital signs and the nursing notes.                             37 year old female presents for evaluation of a nonhealing wound on her big toe.  Elevated BP in  triage otherwise VSS.  Patient NAD on exam.  Differential diagnosis includes, but is not limited to, abscess, ulceration, abrasion.  Patient's presentation is most consistent with acute, uncomplicated illness.  I advised patient to apply a topical triple antibiotic ointment and cover the wound with a bandage.  Patient should change the dressing daily.  She can wash it with soap and water in the shower.  I also sent a 5-day course of Keflex as the wound appears to be infected.  Patient can return to the ED with any new or worsening symptoms.  Patient voiced understanding and all questions were answered.    FINAL CLINICAL IMPRESSION(S) / ED DIAGNOSES   Final diagnoses:  Infected abrasion of right foot, initial encounter     Rx / DC Orders   ED Discharge Orders          Ordered  cephALEXin (KEFLEX) 500 MG capsule  4 times daily        03/29/23 1549             Note:  This document was prepared using Dragon voice recognition software and may include unintentional dictation errors.   Cameron Ali, PA-C 03/29/23 1558    Dionne Bucy, MD 03/29/23 1907

## 2023-03-30 ENCOUNTER — Telehealth: Payer: Self-pay

## 2023-03-30 NOTE — Transitions of Care (Post Inpatient/ED Visit) (Signed)
   03/30/2023  Name: Jean Mckay MRN: [id number] DOB: [birth year]  Today's TOC FU Call Status:    Transition Care Management Follow-up Telephone Call Date of Discharge: 03/29/23 Discharge Facility: [facility]) Type of Discharge: Emergency Department Reason for ED Visit: Other: How have you been since you were released from the hospital?: Better Any questions or concerns?: No  Items Reviewed: Did you receive and understand the discharge instructions provided?: Yes Medications obtained,verified, and reconciled?: Yes (Medications Reviewed) Any new allergies since your discharge?: Yes Dietary orders reviewed?: No Do you have support at home?: Yes  Medications Reviewed Today: Medications Reviewed Today     Reviewed by Mirna Mires, CNM (Certified Nurse Midwife) on 03/04/23 at 1217  Med List Status: <None>   Medication Order Taking? Sig Documenting Provider Last Dose Status Informant    Discontinued 09/22/20 1436   hydrOXYzine (VISTARIL) 25 MG capsule [id number] No Take 1-2 capsules (25-50 mg total) by mouth every 8 (eight) hours as needed for anxiety.  Patient not taking: Reported on 03/04/2023   Jerrol Banana, MD Not Taking Active   ibuprofen (ADVIL) 600 MG tablet [id number] Yes Take 1 tablet (600 mg total) by mouth every 6 (six) hours as needed for cramping. Mirna Mires, CNM  Active   norethindrone [facility]) 0.35 MG tablet [id number] Yes Take 1 tablet (0.35 mg total) by mouth daily. Mirna Mires, CNM  Active   sertraline (ZOLOFT) 25 MG tablet [id number] No Take 1 tablet (25 mg total) by mouth daily.  Patient not taking: Reported on 03/04/2023   Jerrol Banana, MD Not Taking Active             Home Care and Equipment/Supplies: Were Home Health Services Ordered?: No Any new equipment or medical supplies ordered?: No  Functional Questionnaire: Do you need assistance with bathing/showering or dressing?: No Do you  need assistance with meal preparation?: No Do you need assistance with eating?: No Do you have difficulty maintaining continence: No Do you need assistance with getting out of bed/getting out of a chair/moving?: No Do you have difficulty managing or taking your medications?: No  Follow up appointments reviewed: PCP Follow-up appointment confirmed?: Yes Date of PCP follow-up appointment?: 04/03/23 Specialist Hospital Follow-up appointment confirmed?: NA Do you need transportation to your follow-up appointment?: No Do you understand care options if your condition(s) worsen?: Yes-patient verbalized understanding    SIGNATURE

## 2023-04-03 ENCOUNTER — Encounter: Payer: Self-pay | Admitting: Family Medicine

## 2023-04-03 ENCOUNTER — Ambulatory Visit (INDEPENDENT_AMBULATORY_CARE_PROVIDER_SITE_OTHER): Payer: 59 | Admitting: Family Medicine

## 2023-04-03 VITALS — BP 124/76 | HR 78 | Ht 60.0 in | Wt 168.0 lb

## 2023-04-03 DIAGNOSIS — R1310 Dysphagia, unspecified: Secondary | ICD-10-CM

## 2023-04-03 DIAGNOSIS — S90411A Abrasion, right great toe, initial encounter: Secondary | ICD-10-CM | POA: Diagnosis not present

## 2023-04-03 DIAGNOSIS — F418 Other specified anxiety disorders: Secondary | ICD-10-CM | POA: Diagnosis not present

## 2023-04-03 DIAGNOSIS — L089 Local infection of the skin and subcutaneous tissue, unspecified: Secondary | ICD-10-CM

## 2023-04-03 MED ORDER — PANTOPRAZOLE SODIUM 40 MG PO TBEC: 40.0000 mg | DELAYED_RELEASE_TABLET | Freq: Every day | ORAL | 0 refills | Status: DC

## 2023-04-03 NOTE — Assessment & Plan Note (Signed)
Chronic, progressive, in the setting of comorbid GI upset and food-based allergies.  Primarily concerned over the dysphagia component.  We reviewed possible etiologies including allergic, gastrointestinal, etc.  Patient request to be eval by ENT.  - Start pantoprazole daily on empty stomach, continue this until seeing ENT - Referral coordinator will contact to schedule visit with ENT

## 2023-04-03 NOTE — Progress Notes (Signed)
     Primary Care / Sports Medicine Office Visit  Patient Information:  Patient ID: Jean Mckay, female DOB: 1985/10/25 Age: 37 y.o. MRN: [id number]   Jean Mckay is a pleasant 37 y.o. female presenting with the following:  Chief Complaint  Patient presents with   Hospitalization Follow-up    Was running and tripped hurt right big toe, feeling about the same    Vitals:   04/03/23 1415  BP: 124/76  Pulse: 78  SpO2: 98%   Vitals:   04/03/23 1415  Weight: 168 lb (76.2 kg)  Height: 5' (1.524 m)   Body mass index is 32.81 kg/m.  No results found.   Independent interpretation of notes and tests performed by another provider:   None  Procedures performed:   None  Pertinent History, Exam, Impression, and Recommendations:   Jean Mckay was seen today for hospitalization follow-up.  Infected abrasion of great toe of right foot, initial encounter Overview: Date of injury: 03/20/2023  Assessment & Plan: Injury of right great toe on concrete, did not pay much attention to this region but with progressive pain she presented to urgent care on 7/14, treated with Keflex which she has been taking sporadically, noting improvement with additional wound care.  Exam today does demonstrate resolving area of abrasion at the right great toe medially, no discharge, no surrounding erythema.  Plan as follows: - Finish out remaining antibiotics, take as prescribed to limit chances of resistance - Continue local wound care - Follow-up as needed   Dysphagia, unspecified type Assessment & Plan: Chronic, progressive, in the setting of comorbid GI upset and food-based allergies.  Primarily concerned over the dysphagia component.  We reviewed possible etiologies including allergic, gastrointestinal, etc.  Patient request to be eval by ENT.  - Start pantoprazole daily on empty stomach, continue this until seeing ENT - Referral coordinator will contact to schedule  visit with ENT  Orders: -     Pantoprazole Sodium; Take 1 tablet (40 mg total) by mouth daily.  Dispense: 60 tablet; Refill: 0 -     Ambulatory referral to ENT  Anxiety with depression Assessment & Plan: Chronic, ongoing symptomatology.  Patient did not start sertraline, did trial hydroxyzine x 1 and reported diarrhea so discontinued.  We had a lengthy conversation regarding the nature of sertraline versus hydroxyzine, overall management goals, plan as follows:  - Start sertraline daily, anticipate response at the 4-6-week mark - 1 month follow-up for reevaluation    I provided a total time of 60 minutes including both face-to-face and non-face-to-face time on 04/03/2023 inclusive of time utilized for medical chart review, information gathering, care coordination with staff, and documentation completion.   Orders & Medications Meds ordered this encounter  Medications   pantoprazole (PROTONIX) 40 MG tablet    Sig: Take 1 tablet (40 mg total) by mouth daily.    Dispense:  60 tablet    Refill:  0   Orders Placed This Encounter  Procedures   Ambulatory referral to ENT     No follow-ups on file.     Jerrol Banana, MD, [facility]   Primary Care Sports Medicine Primary Care and Sports Medicine at [facility]

## 2023-04-03 NOTE — Assessment & Plan Note (Signed)
Injury of right great toe on concrete, did not pay much attention to this region but with progressive pain she presented to urgent care on 7/14, treated with Keflex which she has been taking sporadically, noting improvement with additional wound care.  Exam today does demonstrate resolving area of abrasion at the right great toe medially, no discharge, no surrounding erythema.  Plan as follows: - Finish out remaining antibiotics, take as prescribed to limit chances of resistance - Continue local wound care - Follow-up as needed

## 2023-04-03 NOTE — Patient Instructions (Addendum)
For toe - Finish out remaining antibiotics, take as prescribed to limit chances of resistance - Continue local wound care - Follow-up as needed  For throat pain -Start pantoprazole daily on empty stomach, continue this until seeing ENT - Referral coordinator will contact to schedule visit with ENT  For stress - Start sertraline daily, anticipate response at the 4-6-week mark - Return in 1 month for follow-up

## 2023-04-03 NOTE — Assessment & Plan Note (Signed)
Chronic, ongoing symptomatology.  Patient did not start sertraline, did trial hydroxyzine x 1 and reported diarrhea so discontinued.  We had a lengthy conversation regarding the nature of sertraline versus hydroxyzine, overall management goals, plan as follows:  - Start sertraline daily, anticipate response at the 4-6-week mark - 1 month follow-up for reevaluation

## 2023-05-08 ENCOUNTER — Telehealth (INDEPENDENT_AMBULATORY_CARE_PROVIDER_SITE_OTHER): Payer: 59 | Admitting: Family Medicine

## 2023-05-08 ENCOUNTER — Encounter: Payer: Self-pay | Admitting: Family Medicine

## 2023-05-08 DIAGNOSIS — S90411D Abrasion, right great toe, subsequent encounter: Secondary | ICD-10-CM

## 2023-05-08 DIAGNOSIS — F418 Other specified anxiety disorders: Secondary | ICD-10-CM | POA: Diagnosis not present

## 2023-05-08 DIAGNOSIS — M549 Dorsalgia, unspecified: Secondary | ICD-10-CM | POA: Diagnosis not present

## 2023-05-08 DIAGNOSIS — L608 Other nail disorders: Secondary | ICD-10-CM | POA: Diagnosis not present

## 2023-05-08 DIAGNOSIS — L089 Local infection of the skin and subcutaneous tissue, unspecified: Secondary | ICD-10-CM

## 2023-05-08 MED ORDER — CYCLOBENZAPRINE HCL 10 MG PO TABS: 10.0000 mg | ORAL_TABLET | Freq: Three times a day (TID) | ORAL | 0 refills | Status: DC | PRN

## 2023-05-08 MED ORDER — SERTRALINE HCL 25 MG PO TABS: 25.0000 mg | ORAL_TABLET | Freq: Every day | ORAL | 0 refills | Status: DC

## 2023-05-08 NOTE — Assessment & Plan Note (Signed)
Chronic, ongoing with bilateral involvement. Has had nails "fall off" before and is interested in further evaluation.  - Referral to Podiatry placed

## 2023-05-08 NOTE — Assessment & Plan Note (Signed)
Resolved, symptoms at baseline.

## 2023-05-08 NOTE — Assessment & Plan Note (Signed)
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 This is an acute flare noted atraumatically but patient reports some possible overuse. Feeling tight, no weakness or radicular features reported, disrupting sleep.  Plan: - Use naproxen, 2 tabs (2 x 220 mg) twice daily with food x 1 week then as-needed - Trial nightly cyclobenzaprine (muscle relaxer) - Referral placed to chiropractor - Start home exercises below - Contact for any issues / lingering pain so we can coordinate next steps

## 2023-05-08 NOTE — Progress Notes (Signed)
Primary Care / Sports Medicine Virtual Visit  Patient Information:  Patient ID: Jean Mckay, female DOB: Jul 11, 1986 Age: 37 y.o. MRN: [id number]   Aleynah Blumberg is a pleasant 37 y.o. female presenting with the following:  Chief Complaint  Patient presents with   Medication Refill    Review of Systems: No fevers, chills, night sweats, weight loss, chest pain, or shortness of breath.   Patient Active Problem List   Diagnosis Date Noted   Multilevel spine pain 05/08/2023   Toenail deformity 05/08/2023   Dysphagia 04/03/2023   Infected abrasion of great toe of right foot 04/03/2023   Fibroids, submucosal 03/04/2023   Anemia 03/03/2023   Primary hypertension 03/03/2023   Elevated fasting glucose 03/03/2023   Pelvic pain 03/03/2023   Anxiety with depression 04/29/2022   Hx of postpartum depression 2007 06/03/2019   Smoker 5-15 cpd 06/03/2019   Past Medical History:  Diagnosis Date   Anemia    Previous pregnancy   Anxiety    Endometriosis 2012   Gestational diabetes    controlled on diet with G2   Post partum depression 2007   Outpatient Encounter Medications as of 05/08/2023  Medication Sig   ibuprofen (ADVIL) 600 MG tablet Take 1 tablet (600 mg total) by mouth every 6 (six) hours as needed for cramping.   norethindrone (MICRONOR) 0.35 MG tablet Take 1 tablet (0.35 mg total) by mouth daily.   pantoprazole (PROTONIX) 40 MG tablet Take 1 tablet (40 mg total) by mouth daily.   Vitamin D, Ergocalciferol, (DRISDOL) 1.25 MG (50000 UNIT) CAPS capsule Take 1 capsule (50,000 Units total) by mouth every 7 (seven) days. Take for 8 total doses(weeks)   [DISCONTINUED] hydrOXYzine (VISTARIL) 25 MG capsule Take 1-2 capsules (25-50 mg total) by mouth every 8 (eight) hours as needed for anxiety.   [DISCONTINUED] sertraline (ZOLOFT) 25 MG tablet Take 1 tablet (25 mg total) by mouth daily.   cyclobenzaprine (FLEXERIL) 10 MG tablet Take 1 tablet (10 mg total) by  mouth 3 (three) times daily as needed for muscle spasms.   sertraline (ZOLOFT) 25 MG tablet Take 1 tablet (25 mg total) by mouth daily.   [DISCONTINUED] cetirizine (ZYRTEC) 10 MG tablet Take 1 tablet (10 mg total) by mouth daily.   No facility-administered encounter medications on file as of 05/08/2023.   Past Surgical History:  Procedure Laterality Date   CESAREAN SECTION  05/16/2014   FTP   CESAREAN SECTION N/A 06/04/2017   Procedure: CESAREAN SECTION;  Surgeon: Conard Novak, MD;  Location: [facility] ORS;  Service: Obstetrics;  Laterality: N/A;   WISDOM TOOTH EXTRACTION      Virtual Visit via MyChart Video:   I connected with Luane School on 05/08/23 via MyChart Video and verified that I am speaking with the correct person using appropriate identifiers.   The limitations, risks, security and privacy concerns of performing an evaluation and management service by MyChart Video, including the higher likelihood of inaccurate diagnoses and treatments, and the availability of in person appointments were reviewed. The possible need of an additional face-to-face encounter for complete and high quality delivery of care was discussed. The patient was also made aware that there may be a patient responsible charge related to this service. The patient expressed understanding and wishes to proceed.  Provider location is in medical facility. Patient location is outside of their home, different from provider location. People involved in care of the patient during this telehealth encounter were myself, my  nurse/medical assistant, and my front office/scheduling team member.  Objective findings:   General: Speaking full sentences, no audible heavy breathing. Sounds alert and appropriately interactive. Well-appearing. Face symmetric. Extraocular movements intact. Pupils equal and round. No nasal flaring or accessory muscle use visualized.  Independent interpretation of notes and tests  performed by another provider:   None  Pertinent History, Exam, Impression, and Recommendations:   Toenail deformity Chronic, ongoing with bilateral involvement. Has had nails "fall off" before and is interested in further evaluation.  - Referral to Podiatry placed  Infected abrasion of great toe of right foot Resolved, symptoms at baseline.  Anxiety with depression Doing well with sertraline 25 mg daily. Discussed titration vs monitoring.  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 This is an acute flare noted atraumatically but patient reports some possible overuse. Feeling tight, no weakness or radicular features reported, disrupting sleep.  Plan: - Use naproxen, 2 tabs (2 x 220 mg) twice daily with food x 1 week then as-needed - Trial nightly cyclobenzaprine (muscle relaxer) - Referral placed to chiropractor - Start home exercises below - Contact for any issues / lingering pain so we can coordinate next steps  Orders & Medications Meds ordered this encounter  Medications   sertraline (ZOLOFT) 25 MG tablet    Sig: Take 1 tablet (25 mg total) by mouth daily.    Dispense:  90 tablet    Refill:  0   cyclobenzaprine (FLEXERIL) 10 MG tablet    Sig: Take 1 tablet (10 mg total) by mouth 3 (three) times daily as needed for muscle spasms.    Dispense:  21 tablet    Refill:  0   Orders Placed This Encounter  Procedures   Ambulatory referral to Podiatry   Ambulatory referral to Chiropractic     I discussed the above assessment and treatment plan with the patient. The patient was provided an opportunity to ask questions and all were answered. The patient agreed with the plan and demonstrated an understanding of the instructions.   The patient was advised  to call back or seek an in-person evaluation if the symptoms worsen or if the condition fails to improve as anticipated.   I provided a total time of 30 minutes including both face-to-face and non-face-to-face time on 05/08/2023 inclusive of time utilized for medical chart review, information gathering, care coordination with staff, and documentation completion.    Jerrol Banana, MD, [facility]   Primary Care Sports Medicine Primary Care and Sports Medicine at [facility]

## 2023-05-08 NOTE — Patient Instructions (Addendum)
-   Continue sertraline 25 mg daily - Use naproxen, 2 tabs (2 x 220 mg) twice daily with food x 1 week then as-needed - Trial nightly cyclobenzaprine (muscle relaxer) on an as-needed basis - Referral placed to chiropractor and podiatrist - Start home exercises below - Contact for any issues / lingering pain so we can coordinate next steps  Exercises: https://orthoinfo.aaos.org/globalassets/pdfs/spine-conditioning-program.pdf

## 2023-05-08 NOTE — Assessment & Plan Note (Signed)
Doing well with sertraline 25 mg daily. Discussed titration vs monitoring.  - Continue at current sertraline 25 mg dose daily

## 2023-05-11 ENCOUNTER — Encounter: Payer: Self-pay | Admitting: Family Medicine

## 2023-05-11 NOTE — Telephone Encounter (Signed)
Please advise, no email just through [employer]

## 2023-05-15 ENCOUNTER — Ambulatory Visit: Payer: Self-pay

## 2023-06-16 ENCOUNTER — Encounter: Payer: Self-pay | Admitting: Family Medicine

## 2023-06-19 ENCOUNTER — Encounter: Payer: Self-pay | Admitting: Family Medicine

## 2023-06-19 ENCOUNTER — Telehealth (INDEPENDENT_AMBULATORY_CARE_PROVIDER_SITE_OTHER): Payer: 59 | Admitting: Family Medicine

## 2023-06-19 DIAGNOSIS — F418 Other specified anxiety disorders: Secondary | ICD-10-CM

## 2023-06-19 MED ORDER — SERTRALINE HCL 50 MG PO TABS: 50.0000 mg | ORAL_TABLET | Freq: Every day | ORAL | 3 refills | Status: DC

## 2023-06-19 NOTE — Assessment & Plan Note (Signed)
Patient presents for follow-up to anxiety and depression, having to miss work days due to symptoms, feels "highs and lows" at varying times throughout the day.  Of note, at last visit on 8/23 she reported doing well and opted to continue current sertraline 25 mg.  She does bring up skipping several days of doses of medication while on-call for work.  We spent a length of time discussing the nature of sertraline, what conditions it is being used for, and appropriate dosing to allow for response.  We also discussed the strength of her current dose as well as options moving forward.  Plan: - Increase sertraline to 50 mg daily - MyChart visit in 4 weeks - Referral to psychiatry for further evaluation management

## 2023-06-19 NOTE — Progress Notes (Signed)
Primary Care / Sports Medicine Virtual Visit  Patient Information:  Patient ID: Jean Mckay, female DOB: 17-Oct-1985 Age: 37 y.o. MRN: [id number]   Jean Mckay is a pleasant 37 y.o. female presenting with the following:  Chief Complaint  Patient presents with   Medication Problem    Depression    Review of Systems: No fevers, chills, night sweats, weight loss, chest pain, or shortness of breath.   Patient Active Problem List   Diagnosis Date Noted   Multilevel spine pain 05/08/2023   Toenail deformity 05/08/2023   Dysphagia 04/03/2023   Infected abrasion of great toe of right foot 04/03/2023   Fibroids, submucosal 03/04/2023   Anemia 03/03/2023   Primary hypertension 03/03/2023   Elevated fasting glucose 03/03/2023   Pelvic pain 03/03/2023   Anxiety with depression 04/29/2022   Hx of postpartum depression 2007 06/03/2019   Smoker 5-15 cpd 06/03/2019   Past Medical History:  Diagnosis Date   Anemia    Previous pregnancy   Anxiety    Endometriosis 2012   Gestational diabetes    controlled on diet with G2   Post partum depression 2007   Outpatient Encounter Medications as of 06/19/2023  Medication Sig   norethindrone (MICRONOR) 0.35 MG tablet Take 1 tablet (0.35 mg total) by mouth daily.   pantoprazole (PROTONIX) 40 MG tablet Take 1 tablet (40 mg total) by mouth daily.   sertraline (ZOLOFT) 50 MG tablet Take 1 tablet (50 mg total) by mouth daily.   [DISCONTINUED] cyclobenzaprine (FLEXERIL) 10 MG tablet Take 1 tablet (10 mg total) by mouth 3 (three) times daily as needed for muscle spasms.   [DISCONTINUED] ibuprofen (ADVIL) 600 MG tablet Take 1 tablet (600 mg total) by mouth every 6 (six) hours as needed for cramping.   [DISCONTINUED] sertraline (ZOLOFT) 25 MG tablet Take 1 tablet (25 mg total) by mouth daily.   [DISCONTINUED] Vitamin D, Ergocalciferol, (DRISDOL) 1.25 MG (50000 UNIT) CAPS capsule Take 1 capsule (50,000 Units total) by  mouth every 7 (seven) days. Take for 8 total doses(weeks)   [DISCONTINUED] cetirizine (ZYRTEC) 10 MG tablet Take 1 tablet (10 mg total) by mouth daily.   No facility-administered encounter medications on file as of 06/19/2023.   Past Surgical History:  Procedure Laterality Date   CESAREAN SECTION  05/16/2014   FTP   CESAREAN SECTION N/A 06/04/2017   Procedure: CESAREAN SECTION;  Surgeon: Conard Novak, MD;  Location: [facility] ORS;  Service: Obstetrics;  Laterality: N/A;   WISDOM TOOTH EXTRACTION      Virtual Visit via MyChart Video:   I connected with Jean Mckay on 06/19/23 via MyChart Video and verified that I am speaking with the correct person using appropriate identifiers.   The limitations, risks, security and privacy concerns of performing an evaluation and management service by MyChart Video, including the higher likelihood of inaccurate diagnoses and treatments, and the availability of in person appointments were reviewed. The possible need of an additional face-to-face encounter for complete and high quality delivery of care was discussed. The patient was also made aware that there may be a patient responsible charge related to this service. The patient expressed understanding and wishes to proceed.  Provider location is in medical facility. Patient location is at their home, different from provider location. People involved in care of the patient during this telehealth encounter were myself, my nurse/medical assistant, and my front office/scheduling team member.  Objective findings:   General: Speaking full sentences, no  audible heavy breathing. Sounds alert and appropriately interactive. Well-appearing. Face symmetric. Extraocular movements intact. Pupils equal and round. No nasal flaring or accessory muscle use visualized.  Independent interpretation of notes and tests performed by another provider:   None  Pertinent History, Exam, Impression, and  Recommendations:   Problem List Items Addressed This Visit       Other   Anxiety with depression - Primary    Patient presents for follow-up to anxiety and depression, having to miss work days due to symptoms, feels "highs and lows" at varying times throughout the day.  Of note, at last visit on 8/23 she reported doing well and opted to continue current sertraline 25 mg.  She does bring up skipping several days of doses of medication while on-call for work.  We spent a length of time discussing the nature of sertraline, what conditions it is being used for, and appropriate dosing to allow for response.  We also discussed the strength of her current dose as well as options moving forward.  Plan: - Increase sertraline to 50 mg daily - MyChart visit in 4 weeks - Referral to psychiatry for further evaluation management      Relevant Medications   sertraline (ZOLOFT) 50 MG tablet   Other Relevant Orders   Ambulatory referral to Psychiatry     Orders & Medications Medications:  Meds ordered this encounter  Medications   sertraline (ZOLOFT) 50 MG tablet    Sig: Take 1 tablet (50 mg total) by mouth daily.    Dispense:  30 tablet    Refill:  3   Orders Placed This Encounter  Procedures   Ambulatory referral to Psychiatry     I discussed the above assessment and treatment plan with the patient. The patient was provided an opportunity to ask questions and all were answered. The patient agreed with the plan and demonstrated an understanding of the instructions.   The patient was advised to call back or seek an in-person evaluation if the symptoms worsen or if the condition fails to improve as anticipated.   I provided a total time of 30 minutes including both face-to-face and non-face-to-face time on 06/19/2023 inclusive of time utilized for medical chart review, information gathering, care coordination with staff, and documentation completion.    Jerrol Banana, MD, [facility]   Primary  Care Sports Medicine Primary Care and Sports Medicine at [facility]

## 2023-06-19 NOTE — Patient Instructions (Signed)
-   Start sertraline 50 mg daily (can double current sertraline 25 mg dose until picking up new Rx) - Referral coordinator will contact to schedule visit with psychiatry - Review information attached and make changes replicable - MyChart video visit in 4 weeks - Contact us for any question/concerns between now and then

## 2023-06-23 ENCOUNTER — Ambulatory Visit (INDEPENDENT_AMBULATORY_CARE_PROVIDER_SITE_OTHER): Payer: 59 | Admitting: Podiatry

## 2023-06-23 DIAGNOSIS — B353 Tinea pedis: Secondary | ICD-10-CM | POA: Diagnosis not present

## 2023-06-23 DIAGNOSIS — Z79899 Other long term (current) drug therapy: Secondary | ICD-10-CM

## 2023-06-23 DIAGNOSIS — B351 Tinea unguium: Secondary | ICD-10-CM | POA: Diagnosis not present

## 2023-06-23 MED ORDER — TERBINAFINE HCL 250 MG PO TABS: 250.0000 mg | ORAL_TABLET | Freq: Every day | ORAL | 0 refills | Status: DC

## 2023-06-23 MED ORDER — CLOTRIMAZOLE-BETAMETHASONE 1-0.05 % EX CREA: 1.0000 | TOPICAL_CREAM | Freq: Every day | CUTANEOUS | 0 refills | Status: DC

## 2023-06-23 NOTE — Progress Notes (Signed)
Subjective:  Patient ID: Jean Mckay, female    DOB: July 03, 1986,  MRN: [id number]  No chief complaint on file.   37 y.o. female presents with the above complaint.  Patient presents with bilateral hallux thickened onychodystrophy mycotic nail x 2.  She wanted to discuss treatment options for it.  She also has complaints of skin epidermolysis and itching to bilateral lower extremity.  She has not tried anything for it.  She wanted to get it evaluated.  She denies any other acute complaints.   Review of Systems: Negative except as noted in the HPI. Denies N/V/F/Ch.  Past Medical History:  Diagnosis Date   Anemia    Previous pregnancy   Anxiety    Endometriosis 2012   Gestational diabetes    controlled on diet with G2   Post partum depression 2007    Current Outpatient Medications:    clotrimazole-betamethasone (LOTRISONE) cream, Apply 1 Application topically daily., Disp: 30 g, Rfl: 0   terbinafine (LAMISIL) 250 MG tablet, Take 1 tablet (250 mg total) by mouth daily., Disp: 90 tablet, Rfl: 0   terbinafine (LAMISIL) 250 MG tablet, Take 1 tablet (250 mg total) by mouth daily., Disp: 90 tablet, Rfl: 0   norethindrone (MICRONOR) 0.35 MG tablet, Take 1 tablet (0.35 mg total) by mouth daily., Disp: 84 tablet, Rfl: 4   pantoprazole (PROTONIX) 40 MG tablet, Take 1 tablet (40 mg total) by mouth daily., Disp: 60 tablet, Rfl: 0   sertraline (ZOLOFT) 50 MG tablet, Take 1 tablet (50 mg total) by mouth daily., Disp: 30 tablet, Rfl: 3  Social History   Tobacco Use  Smoking Status Every Day   Current packs/day: 0.75   Average packs/day: 0.7 packs/day for 19.8 years (14.9 ttl pk-yrs)   Types: Cigarettes, E-cigarettes, Cigars   Start date: 2005   Passive exposure: Current  Smokeless Tobacco Never    Allergies  Allergen Reactions   Shellfish Allergy Swelling    Feeling tightness and irritation in throat   Metronidazole Rash    June 2015 June 2015   Pork-Derived Products  Diarrhea and Rash   Objective:  There were no vitals filed for this visit. There is no height or weight on file to calculate BMI. Constitutional Well developed. Well nourished.  Vascular Dorsalis pedis pulses palpable bilaterally. Posterior tibial pulses palpable bilaterally. Capillary refill normal to all digits.  No cyanosis or clubbing noted. Pedal hair growth normal.  Neurologic Normal speech. Oriented to person, place, and time. Epicritic sensation to light touch grossly present bilaterally.  Dermatologic Bilateral hallux thickened and onychodystrophy mycotic toenails x 2.  Mild pain on palpation.  Bilateral skin epidermolysis noted with subjective complaint of itching consistent with athlete's foot  Orthopedic: Normal joint ROM without pain or crepitus bilaterally. No visible deformities. No bony tenderness.   Radiographs: None Assessment:   1. Encounter for long-term (current) use of high-risk medication   2. Tinea pedis of both feet   3. Nail fungus   4. Onychomycosis due to dermatophyte    Plan:  Patient was evaluated and treated and all questions answered.  Bilateral hallux onychomycosis -Educated the patient on the etiology of onychomycosis and various treatment options associated with improving the fungal load.  I explained to the patient that there is 3 treatment options available to treat the onychomycosis including topical, p.o., laser treatment.  Patient elected to undergo p.o. options with Lamisil/terbinafine therapy.  In order for me to start the medication therapy, I explained to the patient the  importance of evaluating the liver and obtaining the liver function test.  Once the liver function test comes back normal I will start him on 6-month course of Lamisil therapy.  Patient understood all risk and would like to proceed with Lamisil therapy.  I have asked the patient to immediately stop the Lamisil therapy if she has any reactions to it and call the office or go  to the emergency room right away.  Patient states understanding  Bilateral athlete's foot -All questions and concerns were discussed with the patient in extensive detail given the amount of pain that she is having she will benefit from Lotrisone cream.  Lotrisone cream was sent to the pharmacy.  Advised to apply twice a day she states understanding

## 2023-06-24 LAB — HEPATIC FUNCTION PANEL
ALT: 22 [IU]/L (ref 0–32)
AST: 22 [IU]/L (ref 0–40)
Albumin: 4.2 g/dL (ref 3.9–4.9)
Alkaline Phosphatase: 69 [IU]/L (ref 44–121)
Bilirubin Total: 0.3 mg/dL (ref 0.0–1.2)
Bilirubin, Direct: <0.1 mg/dL (ref 0.00–0.40)
Total Protein: 6.9 g/dL (ref 6.0–8.5)

## 2023-06-24 MED ORDER — TERBINAFINE HCL 250 MG PO TABS: 250.0000 mg | ORAL_TABLET | Freq: Every day | ORAL | 0 refills | Status: DC

## 2023-07-23 ENCOUNTER — Telehealth: Payer: 59 | Admitting: Family Medicine

## 2023-08-28 ENCOUNTER — Encounter: Payer: 59 | Admitting: Family Medicine

## 2023-09-18 ENCOUNTER — Other Ambulatory Visit (HOSPITAL_COMMUNITY)
Admission: RE | Admit: 2023-09-18 | Discharge: 2023-09-18 | Disposition: A | Discharge status: Home / Self Care | Payer: 59 | Source: Ambulatory Visit | Attending: Obstetrics | Admitting: Obstetrics

## 2023-09-18 ENCOUNTER — Ambulatory Visit (INDEPENDENT_AMBULATORY_CARE_PROVIDER_SITE_OTHER): Payer: 59

## 2023-09-18 ENCOUNTER — Encounter: Payer: Self-pay | Admitting: Obstetrics and Gynecology

## 2023-09-18 VITALS — BP 131/88 | HR 70 | Wt 165.6 lb

## 2023-09-18 DIAGNOSIS — Z113 Encounter for screening for infections with a predominantly sexual mode of transmission: Secondary | ICD-10-CM

## 2023-09-18 DIAGNOSIS — Z202 Contact with and (suspected) exposure to infections with a predominantly sexual mode of transmission: Secondary | ICD-10-CM

## 2023-09-18 NOTE — Progress Notes (Signed)
    NURSE VISIT NOTE  Subjective:    Patient ID: Jean Mckay, female    DOB: 1985/12/06, 38 y.o.   MRN: [id number]  HPI  Patient is a 38 y.o. [id number] female who presents for STD screening, patient reports with new sexual partner Denies abnormal vaginal bleeding or significant pelvic pain or fever. denies dysuria, hematuria, urinary frequency, urinary urgency, flank pain, abdominal pain, pelvic pain, cloudy malordorous urine, genital rash, genital irritation, and vaginal discharge. Patient denies history of known exposure to STD.   Objective:    BP 131/88   Pulse 70   Wt 165 lb 9.6 oz (75.1 kg)   LMP 08/17/2023 (Approximate)   BMI 32.34 kg/m      Assessment:   1. Screening examination for STD (sexually transmitted disease)     rule out GC or chlamydia  Plan:   GC and chlamydia DNA  probe sent to lab. ROV prn if symptoms persist or worsen.   Rollo JINNY Maxin, CMA

## 2023-09-18 NOTE — Patient Instructions (Signed)
Safe Sex Practicing safe sex means taking steps before and during sex to reduce your risk of: Getting an STI (sexually transmitted infection). Giving your partner an STI. Unwanted or unplanned pregnancy. How to practice safe sex Ways you can practice safe sex  Limit your sexual partners to only one partner who is having sex with only you. Avoid using alcohol and drugs before having sex. Alcohol and drugs can affect your judgment. Before having sex with a new partner: Talk to your partner about past partners, past STIs, and drug use. Get screened for STIs and discuss the results with your partner. Ask your partner to get screened too. Check your body regularly for sores, blisters, rashes, or unusual discharge. If you notice any of these problems, visit your health care provider. Avoid sexual contact if you have symptoms of an infection or you are being treated for an STI. While having sex, use a condom. Make sure to: Use a condom every time you have vaginal, oral, or anal sex. Both females and males should wear condoms during oral sex. Keep condoms in place from the beginning to the end of sexual activity. Use a latex condom, if possible. Latex condoms offer the best protection. Use only water-based lubricants with a condom. Using petroleum-based lubricants or oils will weaken the condom and increase the chance that it will break. Ways your health care provider can help you practice safe sex  See your health care provider for regular screenings, exams, and tests for STIs. Talk with your health care provider about what kind of birth control (contraception) is best for you. Get vaccinated against hepatitis B and human papillomavirus (HPV). If you are at risk of being infected with HIV (human immunodeficiency virus), talk with your health care provider about taking a prescription medicine to prevent HIV infection. You are at risk for HIV if you: Are a man who has sex with other men. Are  sexually active with more than one partner. Take drugs by injection. Have a sex partner who has HIV. Have unprotected sex. Have sex with someone who has sex with both men and women. Have had an STI. Follow these instructions at home: Take over-the-counter and prescription medicines only as told by your health care provider. Keep all follow-up visits. This is important. Where to find more information Centers for Disease Control and Prevention: www.cdc.gov Planned Parenthood: www.plannedparenthood.org Office on Women's Health: www.womenshealth.gov Summary Practicing safe sex means taking steps before and during sex to reduce your risk getting an STI, giving your partner an STI, and having an unwanted or unplanned pregnancy. Before having sex with a new partner, talk to your partner about past partners, past STIs, and drug use. Use a condom every time you have vaginal, oral, or anal sex. Both females and males should wear condoms during oral sex. Check your body regularly for sores, blisters, rashes, or unusual discharge. If you notice any of these problems, visit your health care provider. See your health care provider for regular screenings, exams, and tests for STIs. This information is not intended to replace advice given to you by your health care provider. Make sure you discuss any questions you have with your health care provider. Document Revised: 02/06/2020 Document Reviewed: 02/06/2020 Elsevier Patient Education  2024 [employer]vier Inc.  

## 2023-09-18 NOTE — Addendum Note (Signed)
 Addended by: Sheliah Hatch on: 09/18/2023 08:53 AM   Modules accepted: Orders

## 2023-09-21 LAB — CERVICOVAGINAL ANCILLARY ONLY
Bacterial Vaginitis (gardnerella): POSITIVE — AB
Candida Glabrata: NEGATIVE
Candida Vaginitis: NEGATIVE
Chlamydia: NEGATIVE
Comment: NEGATIVE
Comment: NEGATIVE
Comment: NEGATIVE
Comment: NEGATIVE
Comment: NEGATIVE
Comment: NORMAL
Neisseria Gonorrhea: NEGATIVE
Trichomonas: NEGATIVE

## 2023-09-22 ENCOUNTER — Encounter: Payer: Self-pay | Admitting: Certified Nurse Midwife

## 2023-09-22 ENCOUNTER — Other Ambulatory Visit: Payer: Self-pay | Admitting: Certified Nurse Midwife

## 2023-09-22 DIAGNOSIS — B9689 Other specified bacterial agents as the cause of diseases classified elsewhere: Secondary | ICD-10-CM

## 2023-09-22 MED ORDER — CLINDAMYCIN HCL 300 MG PO CAPS
300.0000 mg | ORAL_CAPSULE | Freq: Two times a day (BID) | ORAL | 0 refills | Status: DC
Start: 2023-09-22 — End: 2024-01-20

## 2023-09-22 NOTE — Progress Notes (Signed)
 Clindamycin ordered for BV, metronidazole allergy documented.

## 2023-09-27 ENCOUNTER — Emergency Department
Admission: EM | Admit: 2023-09-27 | Discharge: 2023-09-28 | Disposition: A | Discharge status: Home / Self Care | Payer: 59 | Attending: Emergency Medicine | Admitting: Emergency Medicine

## 2023-09-27 ENCOUNTER — Other Ambulatory Visit: Payer: Self-pay

## 2023-09-27 ENCOUNTER — Emergency Department: Payer: 59

## 2023-09-27 DIAGNOSIS — R519 Headache, unspecified: Secondary | ICD-10-CM | POA: Diagnosis present

## 2023-09-27 DIAGNOSIS — R059 Cough, unspecified: Secondary | ICD-10-CM | POA: Insufficient documentation

## 2023-09-27 DIAGNOSIS — R5383 Other fatigue: Secondary | ICD-10-CM | POA: Insufficient documentation

## 2023-09-27 DIAGNOSIS — Z20822 Contact with and (suspected) exposure to covid-19: Secondary | ICD-10-CM | POA: Insufficient documentation

## 2023-09-27 LAB — RESP PANEL BY RT-PCR (RSV, FLU A&B, COVID)  RVPGX2
Influenza A by PCR: NEGATIVE
Influenza B by PCR: NEGATIVE
Resp Syncytial Virus by PCR: NEGATIVE
SARS Coronavirus 2 by RT PCR: NEGATIVE

## 2023-09-27 MED ORDER — ONDANSETRON HCL 4 MG/2ML IJ SOLN
4.0000 mg | Freq: Once | INTRAMUSCULAR | Status: AC
  Administered 2023-09-28: 4 mg via INTRAVENOUS
  Filled 2023-09-27: qty 2

## 2023-09-27 MED ORDER — SODIUM CHLORIDE 0.9 % IV BOLUS (SEPSIS)
1000.0000 mL | Freq: Once | INTRAVENOUS | Status: AC
  Administered 2023-09-28: 1000 mL via INTRAVENOUS

## 2023-09-27 MED ORDER — KETOROLAC TROMETHAMINE 30 MG/ML IJ SOLN
30.0000 mg | Freq: Once | INTRAMUSCULAR | Status: AC
Start: 2023-09-28 — End: 2023-10-02
  Administered 2023-09-28: 30 mg via INTRAVENOUS
  Filled 2023-09-27: qty 1

## 2023-09-27 NOTE — ED Notes (Signed)
 Pt contact made and myself was introduced. Pt is breathing normally and appears relaxed.

## 2023-09-27 NOTE — ED Triage Notes (Signed)
 Pt reports she had not been feeling very well over the last 2 days, reports headache, dizziness, facial pain and photophobia. Denies nausea/vomiting.

## 2023-09-27 NOTE — ED Provider Notes (Signed)
 [facility] Provider Note    Event Date/Time   First MD Initiated Contact with Patient 09/27/23 2332     (approximate)   History   Headache   HPI  Jean Mckay is a 38 y.o. female with history of anxiety who presents to the emergency department with complaints of not feeling well for several days.  Reports having a headache that she cannot get rid of with some photophobia.  Has had nausea without vomiting.  Reports having some diarrhea.  Also complaining of some cough, congestion and sore throat.  She was worried she could have pneumonia.  Denies history of migraine headaches.  No head injury.  Not on blood thinners.  No numbness, tingling or weakness.  No chest pain or shortness of breath.  No urinary symptoms.  States she is feeling very weak, low energy.   History provided by patient.    Past Medical History:  Diagnosis Date   Anemia    Previous pregnancy   Anxiety    Endometriosis 2012   Gestational diabetes    controlled on diet with G2   Post partum depression 2007    Past Surgical History:  Procedure Laterality Date   CESAREAN SECTION  05/16/2014   FTP   CESAREAN SECTION N/A 06/04/2017   Procedure: CESAREAN SECTION;  Surgeon: Leonce Garnette BIRCH, MD;  Location: [facility] ORS;  Service: Obstetrics;  Laterality: N/A;   WISDOM TOOTH EXTRACTION      MEDICATIONS:  Prior to Admission medications   Medication Sig Start Date End Date Taking? Authorizing Provider  clindamycin  (CLEOCIN ) 300 MG capsule Take 1 capsule (300 mg total) by mouth 2 (two) times daily. For seven days 09/22/23   Jayne Harlene CROME, CNM  clotrimazole -betamethasone  (LOTRISONE ) cream Apply 1 Application topically daily. 06/23/23   Tobie Franky SQUIBB, DPM  norethindrone  (MICRONOR ) 0.35 MG tablet Take 1 tablet (0.35 mg total) by mouth daily. 03/04/23   Carlin Rollene HERO, CNM  pantoprazole  (PROTONIX ) 40 MG tablet Take 1 tablet (40 mg total) by mouth daily. 04/03/23   Matthews,  Jason J, MD  sertraline  (ZOLOFT ) 50 MG tablet Take 1 tablet (50 mg total) by mouth daily. 06/19/23   Matthews, Jason J, MD  terbinafine  (LAMISIL ) 250 MG tablet Take 1 tablet (250 mg total) by mouth daily. 06/23/23   Tobie Franky SQUIBB, DPM  terbinafine  (LAMISIL ) 250 MG tablet Take 1 tablet (250 mg total) by mouth daily. 06/24/23   Tobie Franky SQUIBB, DPM  cetirizine  (ZYRTEC ) 10 MG tablet Take 1 tablet (10 mg total) by mouth daily. 01/11/19 09/22/20  Cuthriell, Dorn BIRCH, PA-C    Physical Exam   Triage Vital Signs: ED Triage Vitals  Encounter Vitals Group     BP 09/27/23 2153 (!) 148/100     Systolic BP Percentile --      Diastolic BP Percentile --      Pulse Rate 09/27/23 2153 97     Resp 09/27/23 2153 18     Temp 09/27/23 2153 98.4 F (36.9 C)     Temp Source 09/27/23 2346 Oral     SpO2 09/27/23 2153 97 %     Weight 09/27/23 2152 160 lb (72.6 kg)     Height 09/27/23 2152 5' (1.524 m)     Head Circumference --      Peak Flow --      Pain Score 09/27/23 2151 8     Pain Loc --      Pain Education --  Exclude from Growth Chart --     Most recent vital signs: Vitals:   09/27/23 2153 09/27/23 2346  BP: (!) 148/100 (!) 139/95  Pulse: 97 99  Resp: 18 20  Temp: 98.4 F (36.9 C) 98.2 F (36.8 C)  SpO2: 97% 100%    CONSTITUTIONAL: Alert, responds appropriately to questions. Well-appearing; well-nourished HEAD: Normocephalic, atraumatic EYES: Conjunctivae clear, pupils appear equal, sclera nonicteric ENT: normal nose; moist mucous membranes; No pharyngeal erythema or petechiae, no tonsillar hypertrophy or exudate, no uvular deviation, no unilateral swelling in posterior oropharynx, no trismus or drooling, no muffled voice, normal phonation, no stridor, airway patent. NECK: Supple, normal ROM CARD: RRR; S1 and S2 appreciated RESP: Normal chest excursion without splinting or tachypnea; breath sounds clear and equal bilaterally; no wheezes, no rhonchi, no rales, no hypoxia or respiratory  distress, speaking full sentences ABD/GI: Non-distended; soft, non-tender, no rebound, no guarding, no peritoneal signs BACK: The back appears normal EXT: Normal ROM in all joints; no deformity noted, no edema SKIN: Normal color for age and race; warm; no rash on exposed skin NEURO: Moves all extremities equally, normal speech, normal gait, no facial asymmetry PSYCH: The patient's mood and manner are appropriate.   ED Results / Procedures / Treatments   LABS: (all labs ordered are listed, but only abnormal results are displayed) Labs Reviewed  CBC WITH DIFFERENTIAL/PLATELET - Abnormal; Notable for the following components:      Result Value   RBC 3.81 (*)    Hemoglobin 11.1 (*)    HCT 34.1 (*)    All other components within normal limits  RESP PANEL BY RT-PCR (RSV, FLU A&B, COVID)  [id number]  GROUP A STREP BY PCR  COMPREHENSIVE METABOLIC PANEL  HCG, QUANTITATIVE, PREGNANCY     EKG:   RADIOLOGY: My personal review and interpretation of imaging: Chest x-ray clear.  I have personally reviewed all radiology reports.   DG Chest 2 View Result Date: 09/28/2023 CLINICAL DATA:  Cough, dizziness and headache, not feeling well x2 days. EXAM: CHEST - 2 VIEW COMPARISON:  PA and lateral 06/05/2020 FINDINGS: The heart size and mediastinal contours are within normal limits. Both lungs are clear. The visualized skeletal structures are unremarkable. IMPRESSION: No active cardiopulmonary disease.  Unchanged. Electronically Signed   By: Francis Quam M.D.   On: 09/28/2023 00:21     PROCEDURES:  Critical Care performed: No   Procedures    IMPRESSION / MDM / ASSESSMENT AND PLAN / ED COURSE  I reviewed the triage vital signs and the nursing notes.    Patient here with complaints of cough, congestion, sore throat, headache, generalized weakness and fatigue.    DIFFERENTIAL DIAGNOSIS (includes but not limited to):   Viral illness, dehydration, migraine headache, anemia, electrolyte  derangement, pregnancy, pneumonia, low suspicion for sepsis, bacteremia, meningitis, intracranial hemorrhage, CVT, stroke   Patient's presentation is most consistent with acute presentation with potential threat to life or bodily function.   PLAN: COVID, flu and RSV negative.  Will obtain basic labs, chest x-ray, strep swab.  Will give IV fluids, Toradol , Zofran  for symptomatic relief.   MEDICATIONS GIVEN IN ED: Medications  sodium chloride  0.9 % bolus 1,000 mL (1,000 mLs Intravenous New Bag/Given 09/28/23 0016)  ketorolac  (TORADOL ) 30 MG/ML injection 30 mg (30 mg Intravenous Given 09/28/23 0015)  ondansetron  (ZOFRAN ) injection 4 mg (4 mg Intravenous Given 09/28/23 0015)  droperidol  (INAPSINE ) 2.5 MG/ML injection 2.5 mg (2.5 mg Intravenous Given 09/28/23 0149)     ED COURSE:  1:22 AM  Labs show hemoglobin of 11.  Normal electrolytes.  Pregnancy test negative.  Strep test negative.  Chest x-ray reviewed and interpreted by myself and the radiologist and is clear.   Patient reports no improvement in headache after Toradol , IV fluids.  She drove herself to the emergency department but states she thinks she can get a ride home.  Will give dose of droperidol  and reassess.  2:12 AM  Pt reports her headache has completely resolved.  She states her ride is here and she is ready for discharge.  Suspect viral illness causing patient's symptoms.  Discussed supportive care instructions and return precautions.  Will discharge.   At this time, I do not feel there is any life-threatening condition present. I reviewed all nursing notes, vitals, pertinent previous records.  All lab and urine results, EKGs, imaging ordered have been independently reviewed and interpreted by myself.  I reviewed all available radiology reports from any imaging ordered this visit.  Based on my assessment, I feel the patient is safe to be discharged home without further emergent workup and can continue workup as an outpatient as  needed. Discussed all findings, treatment plan as well as usual and customary return precautions.  They verbalize understanding and are comfortable with this plan.  Outpatient follow-up has been provided as needed.  All questions have been answered.   CONSULTS:  none   OUTSIDE RECORDS REVIEWED: Reviewed last OB/GYN note in July 2018.       FINAL CLINICAL IMPRESSION(S) / ED DIAGNOSES   Final diagnoses:  Generalized headache  Cough, unspecified type  Fatigue, unspecified type     Rx / DC Orders   ED Discharge Orders     None        Note:  This document was prepared using Dragon voice recognition software and may include unintentional dictation errors.   Tuleen Mandelbaum, Josette SAILOR, DO 09/28/23 [phone]

## 2023-09-28 LAB — CBC WITH DIFFERENTIAL/PLATELET
Abs Immature Granulocytes: 0.02 10*3/uL (ref 0.00–0.07)
Basophils Absolute: 0.1 10*3/uL (ref 0.0–0.1)
Basophils Relative: 1 %
Eosinophils Absolute: 0.1 10*3/uL (ref 0.0–0.5)
Eosinophils Relative: 1 %
HCT: 34.1 % — ABNORMAL LOW (ref 36.0–46.0)
Hemoglobin: 11.1 g/dL — ABNORMAL LOW (ref 12.0–15.0)
Immature Granulocytes: 0 %
Lymphocytes Relative: 58 %
Lymphs Abs: 4 10*3/uL (ref 0.7–4.0)
MCH: 29.1 pg (ref 26.0–34.0)
MCHC: 32.6 g/dL (ref 30.0–36.0)
MCV: 89.5 fL (ref 80.0–100.0)
Monocytes Absolute: 0.5 10*3/uL (ref 0.1–1.0)
Monocytes Relative: 7 %
Neutro Abs: 2.3 10*3/uL (ref 1.7–7.7)
Neutrophils Relative %: 33 %
Platelets: 305 10*3/uL (ref 150–400)
RBC: 3.81 MIL/uL — ABNORMAL LOW (ref 3.87–5.11)
RDW: 12.8 % (ref 11.5–15.5)
WBC: 6.9 10*3/uL (ref 4.0–10.5)
nRBC: 0 % (ref 0.0–0.2)

## 2023-09-28 LAB — COMPREHENSIVE METABOLIC PANEL
ALT: 20 U/L (ref 0–44)
AST: 20 U/L (ref 15–41)
Albumin: 4.1 g/dL (ref 3.5–5.0)
Alkaline Phosphatase: 57 U/L (ref 38–126)
Anion gap: 10 (ref 5–15)
BUN: 15 mg/dL (ref 6–20)
CO2: 23 mmol/L (ref 22–32)
Calcium: 9.1 mg/dL (ref 8.9–10.3)
Chloride: 105 mmol/L (ref 98–111)
Creatinine, Ser: 0.61 mg/dL (ref 0.44–1.00)
GFR, Estimated: >60 mL/min (ref >60)
Glucose, Bld: 90 mg/dL (ref 70–99)
Potassium: 3.5 mmol/L (ref 3.5–5.1)
Sodium: 138 mmol/L (ref 135–145)
Total Bilirubin: 0.7 mg/dL (ref 0.0–1.2)
Total Protein: 7.4 g/dL (ref 6.5–8.1)

## 2023-09-28 LAB — HCG, QUANTITATIVE, PREGNANCY: hCG, Beta Chain, Quant, S: <1 m[IU]/mL (ref <5)

## 2023-09-28 LAB — GROUP A STREP BY PCR: Group A Strep by PCR: NOT DETECTED

## 2023-09-28 MED ORDER — DROPERIDOL 2.5 MG/ML IJ SOLN
2.5000 mg | Freq: Once | INTRAMUSCULAR | Status: AC
  Administered 2023-09-28: 2.5 mg via INTRAVENOUS
  Filled 2023-09-28: qty 2

## 2023-09-28 NOTE — Discharge Instructions (Addendum)
 You may alternate Tylenol  1000 mg every 6 hours as needed for pain, fever and Ibuprofen  800 mg every 6-8 hours as needed for pain, fever.  Please take Ibuprofen  with food.  Do not take more than 4000 mg of Tylenol  (acetaminophen ) in a 24 hour period.   I suspect that you have a viral illness causing your symptoms.  Your COVID, flu, RSV and strep test were negative.  Chest x-ray showed no pneumonia.  Your blood work was reassuring.

## 2023-10-02 ENCOUNTER — Encounter: Payer: Self-pay | Admitting: Family Medicine

## 2023-10-02 ENCOUNTER — Ambulatory Visit: Payer: 59 | Admitting: Family Medicine

## 2023-10-02 VITALS — BP 125/90 | HR 94 | Ht 60.0 in | Wt 164.0 lb

## 2023-10-02 DIAGNOSIS — R519 Headache, unspecified: Secondary | ICD-10-CM | POA: Diagnosis not present

## 2023-10-02 DIAGNOSIS — I10 Essential (primary) hypertension: Secondary | ICD-10-CM | POA: Diagnosis not present

## 2023-10-02 MED ORDER — BACLOFEN 5 MG PO TABS: 1.0000 | ORAL_TABLET | Freq: Three times a day (TID) | ORAL | 0 refills | Status: DC | PRN

## 2023-10-02 MED ORDER — CELECOXIB 100 MG PO CAPS: 100.0000 mg | ORAL_CAPSULE | Freq: Two times a day (BID) | ORAL | 0 refills | Status: DC | PRN

## 2023-10-02 NOTE — Assessment & Plan Note (Signed)
Hypertension Recent high blood pressure readings in ER and at work, with a normal reading in the office today. -Monitor blood pressure closely. -Schedule follow-up appointment in two weeks to reassess blood pressure and headache management. -Consider starting antihypertensive medication if blood pressure remains elevated at follow-up.

## 2023-10-02 NOTE — Progress Notes (Signed)
Primary Care / Sports Medicine Office Visit  Patient Information:  Patient ID: Jean Mckay, female DOB: 1985/11/08 Age: 38 y.o. MRN: [id number]   Jean Mckay is a pleasant 38 y.o. female presenting with the following:  Chief Complaint  Patient presents with   Hypertension    Patient presents today for HTN. B/P 158/100 at ER visit on 09/26/22    Vitals:   10/02/23 0919 10/02/23 0937  BP: 110/70 (!) 125/90  Pulse: 94   SpO2: 94%    Vitals:   10/02/23 0919  Weight: 164 lb (74.4 kg)  Height: 5' (1.524 m)   Body mass index is 32.03 kg/m.  DG Chest 2 View Result Date: 09/28/2023 CLINICAL DATA:  Cough, dizziness and headache, not feeling well x2 days. EXAM: CHEST - 2 VIEW COMPARISON:  PA and lateral 06/05/2020 FINDINGS: The heart size and mediastinal contours are within normal limits. Both lungs are clear. The visualized skeletal structures are unremarkable. IMPRESSION: No active cardiopulmonary disease.  Unchanged. Electronically Signed   By: Almira Bar M.D.   On: 09/28/2023 00:21     Independent interpretation of notes and tests performed by another provider:   None  Procedures performed:   None  Pertinent History, Exam, Impression, and Recommendations:   Problem List Items Addressed This Visit     Intractable episodic headache - Primary   History of Present Illness Jean Mckay presents with a chief complaint of persistent headaches since last Thursday. She describes the headaches as a feeling of intense pressure in her head, which has been so severe that it has affected her vision transiently and required her to lay down to manage the pain. The headaches have not responded to Toradol injection and fluids given at the ER on 09/27/2023. 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 She has been taking ibuprofen daily for back  pain.  Physical Exam VITALS: BP- 125/90 General: Well Developed, well nourished, and in no acute distress.  Neuro: Alert and oriented x3, extra-ocular muscles intact, sensation grossly intact. Cranial nerves II through XII are intact, motor, sensory, and coordinative functions are grossly intact. Cardiac: Regular rate and rhythm, no murmurs, rubs, or gallops. Respiratory: Clear to auscultation bilaterally. Not using accessory muscles, speaking in full sentences.   Assessment and Plan Headache - tension vs migraine Persistent headache since last Thursday, with associated visual changes and incapacitation. History of migraines in childhood. Possible tension headache component due to neck muscle tension. -Start Baclofen up to three times a day for muscle relaxation. -Continue Flexeril (Cyclobenzaprine) 10mg  at night as needed for muscle relaxation. -Start Celebrex (Celecoxib) up to twice a day as a stronger anti-inflammatory. -Trial Nurtec for migraine-specific treatment, if available.      Relevant Medications   Baclofen 5 MG TABS   celecoxib (CELEBREX) 100 MG capsule   Primary hypertension   Hypertension Recent high blood pressure readings in ER and at work, with a normal reading in the office today. -Monitor blood pressure closely. -Schedule follow-up appointment in two weeks to reassess blood pressure and headache management. -Consider starting antihypertensive medication if blood pressure remains elevated at follow-up.        Orders & Medications Medications:  Meds ordered this encounter  Medications   Baclofen 5 MG TABS    Sig: Take 1 tablet (5 mg total) by mouth 3 (three) times daily as needed.  Dispense:  90 tablet    Refill:  0   celecoxib (CELEBREX) 100 MG capsule    Sig: Take 1 capsule (100 mg total) by mouth 2 (two) times daily as needed.    Dispense:  60 capsule    Refill:  0   No orders of the defined types were placed in this encounter.    No follow-ups  on file.     Jerrol Banana, MD, [facility]   Primary Care Sports Medicine Primary Care and Sports Medicine at [facility]

## 2023-10-02 NOTE — Patient Instructions (Signed)
Your Care Plan:  - Headache Management:   - We will start you on Baclofen, which can be taken up to three times a day, to help with muscle relaxation.   - Continue Flexeril (Cyclobenzaprine) 10mg  at night as needed for additional muscle relaxation.   - Start Celebrex (Celecoxib) up to twice a day for its stronger anti-inflammatory effects.   - Nurtec will be considered for migraine-specific treatment, if available.  - Hypertension Management:   - You have had recent high blood pressure readings, although today's reading was normal.   - We will closely monitor your blood pressure and reassess it in two weeks.   - If your blood pressure remains high, we may start you on medication to help control it.

## 2023-10-02 NOTE — Assessment & Plan Note (Signed)
History of Present Illness Fae Pippin presents with a chief complaint of persistent headaches since last Thursday. She describes the headaches as a feeling of intense pressure in her head, which has been so severe that it has affected her vision transiently and required her to lay down to manage the pain. The headaches have not responded to Toradol injection and fluids given at the ER on 09/27/2023. 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 She has been taking ibuprofen daily for back pain.  Physical Exam VITALS: BP- 125/90 General: Well Developed, well nourished, and in no acute distress.  Neuro: Alert and oriented x3, extra-ocular muscles intact, sensation grossly intact. Cranial nerves II through XII are intact, motor, sensory, and coordinative functions are grossly intact. Cardiac: Regular rate and rhythm, no murmurs, rubs, or gallops. Respiratory: Clear to auscultation bilaterally. Not using accessory muscles, speaking in full sentences.   Assessment and Plan Headache - tension vs migraine Persistent headache since last Thursday, with associated visual changes and incapacitation. History of migraines in childhood. Possible tension headache component due to neck muscle tension. -Start Baclofen up to three times a day for muscle relaxation. -Continue Flexeril (Cyclobenzaprine) 10mg  at night as needed for muscle relaxation. -Start Celebrex (Celecoxib) up to twice a day as a stronger anti-inflammatory. -Trial Nurtec for migraine-specific treatment, if available.

## 2023-10-22 ENCOUNTER — Ambulatory Visit: Payer: 59 | Admitting: Podiatry

## 2024-01-18 ENCOUNTER — Emergency Department
Admission: EM | Admit: 2024-01-18 | Discharge: 2024-01-18 | Disposition: A | Discharge status: Home / Self Care | Attending: Emergency Medicine | Admitting: Emergency Medicine

## 2024-01-18 ENCOUNTER — Other Ambulatory Visit: Payer: Self-pay

## 2024-01-18 DIAGNOSIS — R509 Fever, unspecified: Secondary | ICD-10-CM | POA: Diagnosis not present

## 2024-01-18 DIAGNOSIS — J020 Streptococcal pharyngitis: Secondary | ICD-10-CM | POA: Diagnosis not present

## 2024-01-18 DIAGNOSIS — J029 Acute pharyngitis, unspecified: Secondary | ICD-10-CM | POA: Diagnosis present

## 2024-01-18 LAB — RESP PANEL BY RT-PCR (RSV, FLU A&B, COVID)  RVPGX2
Influenza A by PCR: NEGATIVE
Influenza B by PCR: NEGATIVE
Resp Syncytial Virus by PCR: NEGATIVE
SARS Coronavirus 2 by RT PCR: NEGATIVE

## 2024-01-18 LAB — GROUP A STREP BY PCR: Group A Strep by PCR: DETECTED — AB

## 2024-01-18 MED ORDER — DEXAMETHASONE 4 MG PO TABS
10.0000 mg | ORAL_TABLET | Freq: Once | ORAL | Status: AC
  Administered 2024-01-18: 10 mg via ORAL
  Filled 2024-01-18: qty 3

## 2024-01-18 MED ORDER — ACETAMINOPHEN 325 MG PO TABS
650.0000 mg | ORAL_TABLET | Freq: Once | ORAL | Status: AC
  Administered 2024-01-18: 650 mg via ORAL
  Filled 2024-01-18: qty 2

## 2024-01-18 MED ORDER — AMOXICILLIN 500 MG PO TABS: 1000.0000 mg | ORAL_TABLET | Freq: Every day | ORAL | 0 refills | Status: AC

## 2024-01-18 MED ORDER — IBUPROFEN 600 MG PO TABS
600.0000 mg | ORAL_TABLET | Freq: Once | ORAL | Status: AC
Start: 2024-01-18 — End: 2024-01-18
  Administered 2024-01-18: 600 mg via ORAL
  Filled 2024-01-18: qty 1

## 2024-01-18 NOTE — Discharge Instructions (Addendum)
 You are seen in the emergency department for fever and sore throat.  You tested positive for strep throat.  You were given a dose of steroids and Motrin  and Tylenol  in the emergency department.  You were started on an antibiotic, take as prescribed.  If you have no improvement of symptoms in 2 days or if you have significantly worsening symptoms return to the emergency department for reevaluation.  Call and follow-up closely with your primary care physician.  Pain control:  Ibuprofen  (motrin /aleve /advil ) - You can take 3 tablets (600 mg) every 6 hours as needed for pain/fever.  Acetaminophen  (tylenol ) - You can take 2 extra strength tablets (1000 mg) every 6 hours as needed for pain/fever.  You can alternate these medications or take them together.  Make sure you eat food/drink water when taking these medications.  Stay hydrated and drink plenty of fluids

## 2024-01-18 NOTE — ED Provider Notes (Signed)
 [facility] Provider Note    Event Date/Time   First MD Initiated Contact with Patient 01/18/24 1240     (approximate)   History   Sore Throat   HPI  Jean Mckay is a 38 y.o. female no significant past medical history presents to the emergency department with sore throat.  Started having a sore throat not feeling well last night.  Started having a fever.  Denies nausea or vomiting.  Drinking liquids without any difficulties with normal urine output.  No recent antibiotic use.  No trouble swallowing food.  Does state that she has a decreased appetite tight.  Multiple children at home and states that her 42-year-old was sick last week but they were not tested for strep.     Physical Exam   Triage Vital Signs: ED Triage Vitals  Encounter Vitals Group     BP 01/18/24 1202 (!) 173/117     Systolic BP Percentile --      Diastolic BP Percentile --      Pulse Rate 01/18/24 1202 (!) 114     Resp 01/18/24 1202 16     Temp 01/18/24 1201 (!) 101.1 F (38.4 C)     Temp Source 01/18/24 1201 Oral     SpO2 01/18/24 1202 100 %     Weight 01/18/24 1224 164 lb 0.4 oz (74.4 kg)     Height 01/18/24 1224 5' (1.524 m)     Head Circumference --      Peak Flow --      Pain Score 01/18/24 1201 7     Pain Loc --      Pain Education --      Exclude from Growth Chart --     Most recent vital signs: Vitals:   01/18/24 1201 01/18/24 1202  BP:  (!) 173/117  Pulse:  (!) 114  Resp:  16  Temp: (!) 101.1 F (38.4 C)   SpO2:  100%    Physical Exam Constitutional:      Appearance: She is well-developed.  HENT:     Head: Atraumatic.     Mouth/Throat:     Mouth: Mucous membranes are moist.     Pharynx: Uvula midline. Oropharyngeal exudate and posterior oropharyngeal erythema present.     Tonsils: Tonsillar exudate present. No tonsillar abscesses. 2+ on the right. 2+ on the left.     Comments: Uvula is midline.  Palpable cervical lymphadenopathy  bilaterally that is worse on the left  Eyes:     Conjunctiva/sclera: Conjunctivae normal.  Cardiovascular:     Rate and Rhythm: Regular rhythm.  Pulmonary:     Effort: No respiratory distress.  Abdominal:     General: There is no distension.  Musculoskeletal:        General: Normal range of motion.     Cervical back: Normal range of motion.  Skin:    General: Skin is warm.  Neurological:     Mental Status: She is alert. Mental status is at baseline.      IMPRESSION / MDM / ASSESSMENT AND PLAN / ED COURSE  I reviewed the triage vital signs and the nursing notes.  Differential diagnosis including strep throat, viral pharyngitis.  No findings concerning for PTA or RPA.  Tolerating p.o. with no difficulties.  No concern for Ludwigs  On arrival febrile and tachycardic given Tylenol   Labs (all labs ordered are listed, but only abnormal results are displayed) Labs interpreted as -    Labs  Reviewed  GROUP A STREP BY PCR - Abnormal; Notable for the following components:      Result Value   Group A Strep by PCR DETECTED (*)    All other components within normal limits  RESP PANEL BY RT-PCR (RSV, FLU A&B, COVID)  [id number]   Patient tested positive for strep throat.  Was given Decadron , Tylenol  and Motrin  in the emergency department.  Able to tolerate p.o.  No signs of a PTA or RPA.  Does not appear clinically dehydrated.  Will start on antibiotic.  Discussed return to the emergency department with no improvement of symptoms or worsening symptoms.  Discussed close follow-up with primary care physician.  No questions at time of discharge.      PROCEDURES:  Critical Care performed: No  Procedures  Patient's presentation is most consistent with acute complicated illness / injury requiring diagnostic workup.   MEDICATIONS ORDERED IN ED: Medications  dexamethasone  (DECADRON ) tablet 10 mg (has no administration in time range)  ibuprofen  (ADVIL ) tablet 600 mg (has no administration  in time range)  acetaminophen  (TYLENOL ) tablet 650 mg (650 mg Oral Given 01/18/24 1206)    FINAL CLINICAL IMPRESSION(S) / ED DIAGNOSES   Final diagnoses:  Strep throat     Rx / DC Orders   ED Discharge Orders          Ordered    amoxicillin  (AMOXIL ) 500 MG tablet  Daily        01/18/24 1328             Note:  This document was prepared using Dragon voice recognition software and may include unintentional dictation errors.   Viviano Ground, MD 01/18/24 1331

## 2024-01-18 NOTE — ED Triage Notes (Signed)
 C/O headache, ear pain, throat pain. Chills, fever since last night.

## 2024-01-18 NOTE — ED Notes (Signed)
 See triage notes. Patient began feeling bad last night, c/o sore throat

## 2024-01-20 ENCOUNTER — Ambulatory Visit (INDEPENDENT_AMBULATORY_CARE_PROVIDER_SITE_OTHER): Admitting: Student

## 2024-01-20 ENCOUNTER — Encounter: Payer: Self-pay | Admitting: Student

## 2024-01-20 VITALS — BP 130/82 | HR 98 | Temp 98.9°F | Ht 60.0 in | Wt 159.6 lb

## 2024-01-20 DIAGNOSIS — B950 Streptococcus, group A, as the cause of diseases classified elsewhere: Secondary | ICD-10-CM

## 2024-01-20 DIAGNOSIS — R519 Headache, unspecified: Secondary | ICD-10-CM

## 2024-01-20 DIAGNOSIS — M791 Myalgia, unspecified site: Secondary | ICD-10-CM | POA: Diagnosis not present

## 2024-01-20 DIAGNOSIS — Z20818 Contact with and (suspected) exposure to other bacterial communicable diseases: Secondary | ICD-10-CM | POA: Diagnosis not present

## 2024-01-20 NOTE — Progress Notes (Signed)
 Established Patient Office Visit  Subjective   Patient ID: Jean Mckay, female    DOB: 1986/08/27  Age: 38 y.o. MRN: [id number]  Chief Complaint  Patient presents with   Sore Throat    Sore Throat  This is a new problem. The current episode started in the past 7 days. The maximum temperature recorded prior to her arrival was 101 - 101.9 F. The fever has been present for 1 to 2 days. Associated symptoms include headaches. Pertinent negatives include no coughing, drooling or trouble swallowing. Associated symptoms comments: Muscle aches. She has had exposure to strep. She has tried acetaminophen  and NSAIDs (amoxicillin (day 3)) for the symptoms. The treatment provided moderate relief.    Patient Active Problem List   Diagnosis Date Noted   Intractable episodic headache 10/02/2023   Multilevel spine pain 05/08/2023   Toenail deformity 05/08/2023   Dysphagia 04/03/2023   Infected abrasion of great toe of right foot 04/03/2023   Fibroids, submucosal 03/04/2023   Anemia 03/03/2023   Primary hypertension 03/03/2023   Elevated fasting glucose 03/03/2023   Pelvic pain 03/03/2023   Anxiety with depression 04/29/2022   Hx of postpartum depression 2007 06/03/2019   Smoker 5-15 cpd 06/03/2019      Review of Systems  HENT:  Negative for drooling and trouble swallowing.   Respiratory:  Negative for cough.   Neurological:  Positive for headaches.   Refer to HPI    Objective:     BP 130/82   Pulse 98   Ht 5' (1.524 m)   Wt 159 lb 9.6 oz (72.4 kg)   SpO2 97%   BMI 31.17 kg/m  BP Readings from Last 3 Encounters:  01/20/24 130/82  01/18/24 (!) 168/100  10/02/23 (!) 125/90    Physical Exam Constitutional:      Appearance: Normal appearance.  HENT:     Mouth/Throat:     Mouth: Mucous membranes are moist.     Pharynx: Posterior oropharyngeal erythema present. No pharyngeal swelling or oropharyngeal exudate.  Cardiovascular:     Rate and Rhythm: Normal rate  and regular rhythm.  Pulmonary:     Effort: Pulmonary effort is normal.     Breath sounds: No rhonchi or rales.  Abdominal:     General: Abdomen is flat. Bowel sounds are normal. There is no distension.     Palpations: Abdomen is soft.     Tenderness: There is no abdominal tenderness.  Musculoskeletal:        General: Normal range of motion.     Right lower leg: No edema.     Left lower leg: No edema.  Skin:    General: Skin is warm and dry.     Capillary Refill: Capillary refill takes less than 2 seconds.  Neurological:     General: No focal deficit present.     Mental Status: She is alert and oriented to person, place, and time.  Psychiatric:        Mood and Affect: Mood normal.        Behavior: Behavior normal.        01/20/2024    4:03 PM 10/02/2023    9:25 AM 06/19/2023   10:51 AM  Depression screen PHQ 2/9  Decreased Interest 0 0 1  Down, Depressed, Hopeless 1 0 0  PHQ - 2 Score 1 0 1  Altered sleeping 1  0  Tired, decreased energy 1  1  Change in appetite 1  1  Feeling bad or failure  about yourself  0  0  Trouble concentrating 1  0  Moving slowly or fidgety/restless 1  0  Suicidal thoughts 0  0  PHQ-9 Score 6  3  Difficult doing work/chores Not difficult at all  Somewhat difficult       01/20/2024    4:03 PM 10/02/2023    9:25 AM 06/19/2023   10:51 AM 05/08/2023   10:08 AM  GAD 7 : Generalized Anxiety Score  Nervous, Anxious, on Edge 1 0 1 1  Control/stop worrying 0 0 1 1  Worry too much - different things 1 0 1 1  Trouble relaxing 1 1 1 1   Restless 1 0 1 1  Easily annoyed or irritable 1 1 0 0  Afraid - awful might happen 0 0 0 0  Total GAD 7 Score 5 2 5 5   Anxiety Difficulty Not difficult at all Not difficult at all Somewhat difficult Somewhat difficult    No results found for any visits on 01/20/24.  Last CBC Lab Results  Component Value Date   WBC 6.9 09/28/2023   HGB 11.1 (L) 09/28/2023   HCT 34.1 (L) 09/28/2023   MCV 89.5 09/28/2023   MCH 29.1  09/28/2023   RDW 12.8 09/28/2023   PLT 305 09/28/2023   Last metabolic panel Lab Results  Component Value Date   GLUCOSE 90 09/28/2023   NA 138 09/28/2023   K 3.5 09/28/2023   CL 105 09/28/2023   CO2 23 09/28/2023   BUN 15 09/28/2023   CREATININE 0.61 09/28/2023   GFRNONAA >60 09/28/2023   CALCIUM 9.1 09/28/2023   PROT 7.4 09/28/2023   ALBUMIN 4.1 09/28/2023   LABGLOB 2.7 03/13/2023   BILITOT 0.7 09/28/2023   ALKPHOS 57 09/28/2023   AST 20 09/28/2023   ALT 20 09/28/2023   ANIONGAP 10 09/28/2023      The ASCVD Risk score (Arnett DK, et al., 2019) failed to calculate for the following reasons:   The 2019 ASCVD risk score is only valid for ages 67 to 67    Assessment & Plan:  Group A streptococcal infection  Positive strep test on 01/18/2024 at [facility] ED. She is on day 3/10 of amoxicillin . Afebrile, no increase work of breathing, speech is clear, without issues with swallowing. Fever has resolved and no new fever. Recommended alternating ibuprofen  600 mg q6 and tylenol  for muscle aches. Hot tea, honey, popsicles for sore throat. Work note provided. Reviewed return precautions.  Return if symptoms worsen or fail to improve.    Barnetta Liberty, MD

## 2024-02-05 ENCOUNTER — Ambulatory Visit: Admitting: Psychiatry

## 2024-02-05 DIAGNOSIS — F411 Generalized anxiety disorder: Secondary | ICD-10-CM | POA: Diagnosis not present

## 2024-02-05 DIAGNOSIS — F99 Mental disorder, not otherwise specified: Secondary | ICD-10-CM | POA: Diagnosis not present

## 2024-02-05 DIAGNOSIS — F331 Major depressive disorder, recurrent, moderate: Secondary | ICD-10-CM | POA: Diagnosis not present

## 2024-02-05 DIAGNOSIS — F5105 Insomnia due to other mental disorder: Secondary | ICD-10-CM

## 2024-02-05 MED ORDER — SERTRALINE HCL 100 MG PO TABS: 100.0000 mg | ORAL_TABLET | Freq: Every day | ORAL | 2 refills | Status: AC

## 2024-02-05 MED ORDER — QUETIAPINE FUMARATE 25 MG PO TABS: 25.0000 mg | ORAL_TABLET | Freq: Every day | ORAL | 0 refills | Status: DC

## 2024-02-05 NOTE — Progress Notes (Cosign Needed)
 Psychiatric Initial Adult Assessment   Patient Identification: Jean Mckay MRN:  [id number] Date of Evaluation:  02/05/2024 Referral Source: Nicky Barrack, MD Chief Complaint: Establishing care  Virtual Visit via Video Note  I connected with Jean Mckay on 02/05/24 at 10:00 AM EDT by a video enabled telemedicine application and verified that I am speaking with the correct person using two identifiers.  Location: Patient: [address]  [place] [address]  Provider: Home office of [facility] provider   I discussed the limitations of evaluation and management by telemedicine and the availability of in person appointments. The patient expressed understanding and agreed to proceed.    I discussed the assessment and treatment plan with the patient. The patient was provided an opportunity to ask questions and all were answered. The patient agreed with the plan and demonstrated an understanding of the instructions.   The patient was advised to call back or seek an in-person evaluation if the symptoms worsen or if the condition fails to improve as anticipated.  I provided 60 minutes of non-face-to-face time during this encounter.   [name], NP    Visit Diagnosis:    ICD-10-CM   1. Moderate episode of recurrent major depressive disorder (HCC)  F33.1 sertraline  (ZOLOFT ) 100 MG tablet    2. Generalized anxiety disorder  F41.1 sertraline  (ZOLOFT ) 100 MG tablet    3. Insomnia due to other mental disorder  F51.05 QUEtiapine (SEROQUEL) 25 MG tablet   F99       History of Present Illness: A 38 year old female presenting to [facility] for establishing care.  Patient reports that she is having anxiety and fatigue that is causing her to not perform well at work.  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  Patient reports that she has been developing generalized fatigue as well as anxiety over the last year  after being started on sertraline .  Patient reports that her PCP tried to manage the symptoms with no success.  Patient reports apparently she is a single mom of 3 and states that she is sleeping 5 to 6 hours a night as well as appetite is good.  She does endorse though that the sleep is not of high quality stating that she is easily awakened throughout the night and states that her children as well as neighbors walk into stairs or examples of will wake her up at night.  Patient reports that the sertraline  is working stating that he is targeting her anxiety and depression and reports that she has no issues with the medication.  Patient denies any previous psychiatric hospitalizations and states that she was diagnosed with postpartum depression 2007 after her first child stay has been consistent since with the depressive symptoms.  Patient reports that she would like to pursue FMLA, she was reminded that the paperwork she has signed in agreement on her care states that FMLA paperwork will only be considered after 6 months of care with our clinic.  In which she verbalized agreement and understanding.  Based on this assessment interview is recommended for patient be diagnosed with major depressive disorder, recurrent, moderate as well as generalized anxiety disorder and insomnia.  Patient will start on Seroquel 25 mg once a night to try to promote the quality of sleep.  The patient will also increase sertraline  to 100 mg once a day due to continued targeting depressive and anxiety symptoms.  The approach is to target sleep to try to  improve mood throughout the day and will follow up in 2 weeks to see symptom status.  Patient is in agreement with treatment plan patient denies SI, HI, AVH.  Patient with no other questions or concerns at this time.  Patient follows up in 2 weeks fortunately.  Associated Signs/Symptoms: Depression Symptoms:  fatigue, difficulty concentrating, hopelessness, (Hypo) Manic Symptoms:  Denies Anxiety Symptoms:  Excessive Worry, Psychotic Symptoms: Denies PTSD Symptoms: Negative  Past Psychiatric History:  Previous Psych Hospitalizations: - Denies  Outpatient treatment:  - Currently being managed by Jean Canada, MD, stating that she is seeking to change providers.  Medications Current: -Sertraline  100 mg once daily - Seroquel 25 mg once daily at night  Next Steps: - Evaluate fatigue throughout the day, after starting sleeping medications to determine determine whether or not bipolar disorder and should be investigated  Medication Trials: - No previous medication trials  Suicide & Violence: - Denies SI, HI, AVH Substance Use: - No substance use concerns Psychotherapy: - Patient has been recommended to [facility] counseling for referral Legal:  - Denies Previous Psychotropic Medications: Yes   Substance Abuse History in the last 12 months:  No.  Consequences of Substance Abuse: Negative  Past Medical History:  Past Medical History:  Diagnosis Date   Anemia    Previous pregnancy   Anxiety    Endometriosis 2012   Gestational diabetes    controlled on diet with G2   Post partum depression 2007    Past Surgical History:  Procedure Laterality Date   CESAREAN SECTION  05/16/2014   FTP   CESAREAN SECTION N/A 06/04/2017   Procedure: CESAREAN SECTION;  Surgeon: Kris Pester, MD;  Location: [facility] ORS;  Service: Obstetrics;  Laterality: N/A;   WISDOM TOOTH EXTRACTION      Family Psychiatric History: Patient endorses biological brother having bipolar and schizophrenia  Family History:  Family History  Problem Relation Age of Onset   Hypertension Mother    Hypertension Father    Osteoarthritis Father    Breast cancer Maternal Grandmother 62   AAA (abdominal aortic aneurysm) Maternal Grandmother    Diabetes Maternal Grandmother    Heart disease Maternal Grandfather    Breast cancer Cousin 40   Breast cancer Cousin 78   Other Half-Brother         blood disorder "runs thin"   Heart failure Other     Social History:   Social History   Socioeconomic History   Marital status: Single    Spouse name: Not on file   Number of children: 2   Years of education: Not on file   Highest education level: Associate degree: academic program  Occupational History   Occupation: Physicist, medical  Tobacco Use   Smoking status: Every Day    Current packs/day: 0.75    Average packs/day: 0.8 packs/day for 20.4 years (15.3 ttl pk-yrs)    Types: Cigarettes, E-cigarettes, Cigars    Start date: 2005    Passive exposure: Current   Smokeless tobacco: Never  Vaping Use   Vaping status: Never Used  Substance and Sexual Activity   Alcohol use: Yes    Alcohol/week: 3.0 standard drinks of alcohol    Types: 3 Shots of liquor per week    Comment: q weekend--3 shots Tequila   Drug use: Not Currently    Types: Marijuana    Comment: last use age 25   Sexual activity: Not Currently    Partners: Male    Birth control/protection: None  Other Topics Concern   Not on file  Social History Narrative   Not on file   Social Drivers of Health   Financial Resource Strain: Patient Declined (10/01/2023)   Overall Financial Resource Strain (CARDIA)    Difficulty of Paying Living Expenses: Patient declined  Food Insecurity: No Food Insecurity (10/01/2023)   Hunger Vital Sign    Worried About Running Out of Food in the Last Year: Never true    Ran Out of Food in the Last Year: Never true  Transportation Needs: No Transportation Needs (10/01/2023)   PRAPARE - Administrator, Civil Service (Medical): No    Lack of Transportation (Non-Medical): No  Physical Activity: Sufficiently Active (10/01/2023)   Exercise Vital Sign    Days of Exercise per Week: 4 days    Minutes of Exercise per Session: 90 min  Stress: No Stress Concern Present (10/01/2023)   [employer] of Occupational Health - Occupational Stress Questionnaire    Feeling of Stress :  Only a little  Social Connections: Unknown (10/01/2023)   Social Connection and Isolation Panel [NHANES]    Frequency of Communication with Friends and Family: More than three times a week    Frequency of Social Gatherings with Friends and Family: Three times a week    Attends Religious Services: 1 to 4 times per year    Active Member of Clubs or Organizations: No    Attends Engineer, structural: Not on file    Marital Status: Patient declined    Additional Social History: No additional social history  Allergies:   Allergies  Allergen Reactions   Shellfish Allergy Swelling    Feeling tightness and irritation in throat   Metronidazole  Rash    June 2015 June 2015   Pork-Derived Products Diarrhea and Rash    Metabolic Disorder Labs: Lab Results  Component Value Date   HGBA1C 5.0 03/13/2023   No results found for: "PROLACTIN" Lab Results  Component Value Date   CHOL 130 03/13/2023   TRIG 162 (H) 03/13/2023   HDL 38 (L) 03/13/2023   CHOLHDL 3.4 03/13/2023   LDLCALC 64 03/13/2023   Lab Results  Component Value Date   TSH 0.619 03/13/2023    Therapeutic Level Labs: No results found for: "LITHIUM" No results found for: "CBMZ" No results found for: "VALPROATE"  Current Medications: Current Outpatient Medications  Medication Sig Dispense Refill   Baclofen  5 MG TABS Take 1 tablet (5 mg total) by mouth 3 (three) times daily as needed. 90 tablet 0   celecoxib  (CELEBREX ) 100 MG capsule Take 1 capsule (100 mg total) by mouth 2 (two) times daily as needed. (Patient not taking: Reported on 01/20/2024) 60 capsule 0   clotrimazole -betamethasone  (LOTRISONE ) cream Apply 1 Application topically daily. (Patient not taking: Reported on 01/20/2024) 30 g 0   norethindrone  (MICRONOR ) 0.35 MG tablet Take 1 tablet (0.35 mg total) by mouth daily. (Patient not taking: Reported on 01/20/2024) 84 tablet 4   pantoprazole  (PROTONIX ) 40 MG tablet Take 1 tablet (40 mg total) by mouth daily.  (Patient not taking: Reported on 01/20/2024) 60 tablet 0   sertraline  (ZOLOFT ) 50 MG tablet Take 1 tablet (50 mg total) by mouth daily. 30 tablet 3   No current facility-administered medications for this visit.    Musculoskeletal: Strength & Muscle Tone: within normal limits Gait & Station: normal Patient leans: N/A  Psychiatric Specialty Exam: Review of Systems  Constitutional: Negative.   HENT: Negative.    Eyes: Negative.  Respiratory: Negative.    Cardiovascular: Negative.   Gastrointestinal: Negative.   Endocrine: Negative.   Genitourinary: Negative.   Musculoskeletal: Negative.   Skin: Negative.   Allergic/Immunologic: Negative.   Neurological: Negative.   Hematological: Negative.   Psychiatric/Behavioral:  Positive for decreased concentration and dysphoric mood. The patient is nervous/anxious.     There were no vitals taken for this visit.There is no height or weight on file to calculate BMI.  General Appearance: Well Groomed  Eye Contact:  Good  Speech:  Clear and Coherent  Volume:  Normal  Mood:  Anxious and Depressed  Affect:  Appropriate  Thought Process:  Coherent  Orientation:  Full (Time, Place, and Person)  Thought Content:  Logical  Suicidal Thoughts:  No  Homicidal Thoughts:  No  Memory:  Immediate;   Good Recent;   Good Remote;   Good  Judgement:  Good  Insight:  Good  Psychomotor Activity:  Normal  Concentration:  Concentration: Good and Attention Span: Good  Recall:  Good  Fund of Knowledge:Good  Language: Good  Akathisia:  No  Handed:  Right  AIMS (if indicated):    Assets:  Desire for Improvement Financial Resources/Insurance Housing  ADL's:  Intact  Cognition: WNL  Sleep:  Good   Screenings: AUDIT    Flowsheet Row Office Visit from 10/02/2023 in [facility] at [facility]  Alcohol Use Disorder Identification Test Final Score (AUDIT) 4       GAD-7    Flowsheet Row Office Visit from 01/20/2024  in [facility] at [facility] Office Visit from 10/02/2023 in [facility] & [facility] Medicine at [facility] Video Visit from 06/19/2023 in [facility] & [facility] Medicine at [facility] Video Visit from 05/08/2023 in [facility] at [facility] Office Visit from 03/03/2023 in [facility] at [facility]  Total GAD-7 Score 5 2 5 5 18       PHQ2-9    Flowsheet Row Office Visit from 01/20/2024 in [facility] at [facility] Office Visit from 10/02/2023 in [facility] at [facility] Video Visit from 06/19/2023 in [facility] Primary Care & Sports Medicine at [facility] Video Visit from 05/08/2023 in [facility] Primary Care & Sports Medicine at [facility] Office Visit from 03/04/2023 in [facility] at [facility] Total Score 1 0 1 1 3   PHQ-9 Total Score 6 -- 3 3 15       Flowsheet Row ED from 01/18/2024 in [facility] at [facility] ED from 09/27/2023 in [facility] Department at [facility] ED from 02/24/2023 in [facility] Emergency Department at [facility]  C-SSRS RISK CATEGORY No Risk No Risk No Risk       Assessment and Plan:  Assessment - Diagnosis: Moderate episode of recurrent major depressive disorder (HCC) [F33.1]  2. Generalized anxiety disorder [F41.1]  3. Insomnia due to other mental disorder [F51.05, F99]  Differential Diagnosis: Bipolar Disorder  - Progress: Baseline appointment - Risk Factors: Worsening symptoms  Plan - Medications:  Increase sertraline  to 100 mg once a day daily for depression and anxiety.  Patient has been educated on the initial irritability, nervousness, jitteriness that may be expected without change. Start Seroquel 25 mg once a day before bed, or insomnia due to mental  disorder, patient has been educated on the sedating nature of the medication instructed to take 30 to 1-hour before bedtime and when she reports that she will go to sleep roughly around 9 p.m. - Psychotherapy: Patient is recommended for psychotherapy at this time patient has been referred to [facility] counseling. - Education: Patient has been educated on outcome provided.  Patient has been encouraged to open a mitral message and to utilize Medscape features on monitoring.  Patient also educated about the clinic in 2 leave a message for the provider.  Patient also educated on medications, on how to use medications, purpose, side effects and adverse reactions. - Follow-Up: Patient will follow up in 2 weeks virtual. - Referrals: Patient referred to read [facility] counseling for therapy. - Safety Planning: The patient has been educated, if they should have suicidal thoughts with or without a plan to call 911, or go to the closest emergency department.  Pt verbalized understanding.  Pt denies firearms within the home.  Pt also agrees to call the clinic should they have worsening symptoms before the next appointment.     Patient/Guardian was advised Release of Information must be obtained prior to any record release in order to collaborate their care with an outside provider. Patient/Guardian was advised if they have not already done so to contact the registration department to sign all necessary forms in order for us  to release information regarding their care.   Consent: Patient/Guardian gives verbal consent for treatment and assignment of benefits for services provided during this visit. Patient/Guardian expressed understanding and agreed to proceed.   This office note has been dictated. This dictation was prepared using Air traffic controller. As a result, errors may occur. When identified, these errors have been corrected. While every attempt is made to correct errors during dictation, errors  may still exist.   [name], NP 5/23/202510:17 AM

## 2024-02-19 ENCOUNTER — Telehealth: Admitting: Psychiatry

## 2024-02-19 DIAGNOSIS — F5105 Insomnia due to other mental disorder: Secondary | ICD-10-CM

## 2024-02-19 DIAGNOSIS — F99 Mental disorder, not otherwise specified: Secondary | ICD-10-CM

## 2024-02-19 DIAGNOSIS — F331 Major depressive disorder, recurrent, moderate: Secondary | ICD-10-CM

## 2024-02-19 DIAGNOSIS — F411 Generalized anxiety disorder: Secondary | ICD-10-CM

## 2024-02-19 NOTE — Progress Notes (Signed)
 No show

## 2024-03-04 ENCOUNTER — Telehealth: Admitting: Psychiatry

## 2024-03-04 DIAGNOSIS — F331 Major depressive disorder, recurrent, moderate: Secondary | ICD-10-CM

## 2024-03-04 DIAGNOSIS — F411 Generalized anxiety disorder: Secondary | ICD-10-CM | POA: Diagnosis not present
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
## 2024-03-04 NOTE — Addendum Note (Signed)
 Addended by: Jaeliana Lococo on: 03/04/2024 11:59 AM   Modules accepted: Level of Service

## 2024-03-25 ENCOUNTER — Telehealth (INDEPENDENT_AMBULATORY_CARE_PROVIDER_SITE_OTHER): Admitting: Psychiatry

## 2024-03-25 DIAGNOSIS — F331 Major depressive disorder, recurrent, moderate: Secondary | ICD-10-CM

## 2024-03-25 DIAGNOSIS — F411 Generalized anxiety disorder: Secondary | ICD-10-CM

## 2024-03-25 DIAGNOSIS — F5105 Insomnia due to other mental disorder: Secondary | ICD-10-CM | POA: Diagnosis not present

## 2024-03-25 DIAGNOSIS — F99 Mental disorder, not otherwise specified: Secondary | ICD-10-CM
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
## 2024-04-06 ENCOUNTER — Telehealth: Payer: Self-pay

## 2024-04-06 NOTE — Telephone Encounter (Signed)
 Received Rx RF request from pharmacy norethindrone  (MICRONOR ) 0.35 MG tablet . Pt over due for annual. Sending mychart msg to schedule annual.

## 2024-04-18 ENCOUNTER — Other Ambulatory Visit: Payer: Self-pay

## 2024-04-18 DIAGNOSIS — N946 Dysmenorrhea, unspecified: Secondary | ICD-10-CM

## 2024-04-18 DIAGNOSIS — N924 Excessive bleeding in the premenopausal period: Secondary | ICD-10-CM

## 2024-04-18 MED ORDER — NORETHINDRONE 0.35 MG PO TABS: 1.0000 | ORAL_TABLET | Freq: Every day | ORAL | 0 refills | Status: DC

## 2024-04-18 NOTE — Telephone Encounter (Signed)
 Patient is scheduled for her annual on 05/13/24 at 1:35 with JEG

## 2024-05-13 ENCOUNTER — Ambulatory Visit: Admitting: Advanced Practice Midwife

## 2024-05-13 NOTE — Patient Instructions (Incomplete)
 Preventive Care 28-36 Years Old, Female  Preventive care refers to lifestyle choices and visits with your health care provider that can promote health and wellness. Preventive care visits are also called wellness exams. What can I expect for my preventive care visit? Counseling During your preventive care visit, your health care provider may ask about your: Medical history, including: Past medical problems. Family medical history. Pregnancy history. Current health, including: Menstrual cycle. Method of birth control. Emotional well-being. Home life and relationship well-being. Sexual activity and sexual health. Lifestyle, including: Alcohol, nicotine or tobacco, and drug use. Access to firearms. Diet, exercise, and sleep habits. Work and work Astronomer. Sunscreen use. Safety issues such as seatbelt and bike helmet use. Physical exam Your health care provider may check your: Height and weight. These may be used to calculate your BMI (body mass index). BMI is a measurement that tells if you are at a healthy weight. Waist circumference. This measures the distance around your waistline. This measurement also tells if you are at a healthy weight and may help predict your risk of certain diseases, such as type 2 diabetes and high blood pressure. Heart rate and blood pressure. Body temperature. Skin for abnormal spots. What immunizations do I need?  Vaccines are usually given at various ages, according to a schedule. Your health care provider will recommend vaccines for you based on your age, medical history, and lifestyle or other factors, such as travel or where you work. What tests do I need? Screening Your health care provider may recommend screening tests for certain conditions. This may include: Pelvic exam and Pap test. Lipid and cholesterol levels. Diabetes screening. This is done by checking your blood sugar (glucose) after you have not eaten for a while  (fasting). Hepatitis B test. Hepatitis C test. HIV (human immunodeficiency virus) test. STI (sexually transmitted infection) testing, if you are at risk. BRCA-related cancer screening. This may be done if you have a family history of breast, ovarian, tubal, or peritoneal cancers. Talk with your health care provider about your test results, treatment options, and if necessary, the need for more tests. Follow these instructions at home: Eating and drinking  Eat a healthy diet that includes fresh fruits and vegetables, whole grains, lean protein, and low-fat dairy products. Take vitamin and mineral supplements as recommended by your health care provider. Do not drink alcohol if: Your health care provider tells you not to drink. You are pregnant, may be pregnant, or are planning to become pregnant. If you drink alcohol: Limit how much you have to 0-1 drink a day. Know how much alcohol is in your drink. In the [place] one drink equals one 12 oz bottle of beer (355 mL), one 5 oz glass of wine (148 mL), or one 1 oz glass of hard liquor (44 mL). Lifestyle Brush your teeth every morning and night with fluoride toothpaste. Floss one time each day. Exercise for at least 30 minutes 5 or more days each week. Do not use any products that contain nicotine or tobacco. These products include cigarettes, chewing tobacco, and vaping devices, such as e-cigarettes. If you need help quitting, ask your health care provider. Do not use drugs. If you are sexually active, practice safe sex. Use a condom or other form of protection to prevent STIs. If you do not wish to become pregnant, use a form of birth control. If you plan to become pregnant, see your health care provider for a prepregnancy visit. Find healthy ways to manage stress, such as:  Meditation, yoga, or listening to music. Journaling. Talking to a trusted person. Spending time with friends and family. Minimize exposure to UV radiation to reduce your  risk of skin cancer. Safety Always wear your seat belt while driving or riding in a vehicle. Do not drive: If you have been drinking alcohol. Do not ride with someone who has been drinking. If you have been using any mind-altering substances or drugs. While texting. When you are tired or distracted. Wear a helmet and other protective equipment during sports activities. If you have firearms in your house, make sure you follow all gun safety procedures. Seek help if you have been physically or sexually abused. What's next? Go to your health care provider once a year for an annual wellness visit. Ask your health care provider how often you should have your eyes and teeth checked. Stay up to date on all vaccines. This information is not intended to replace advice given to you by your health care provider. Make sure you discuss any questions you have with your health care provider. Document Revised: 02/27/2021 Document Reviewed: 02/27/2021 Elsevier Patient Education  2024 Elsevier Inc.     How to Do a Breast Self-Exam Doing breast self-exams can help you stay healthy. They're one way to know what's normal for your breasts. They can help you catch a problem while it's still small and can be treated. You need to: Check your breasts often. Tell your doctor about any changes. You should do breast self-exams even if you have breast implants. What you need: A mirror. A well-lit room. A pillow or other soft object. How to do a breast self-exam Look for changes  Take off all the clothes above your waist. Stand in front of a mirror in a room with good lighting. Put your hands down at your sides. Compare your breasts in the mirror. Look for difference between them, such as: Differences in shape. Differences in size. Wrinkles, dips, and bumps in one breast and not the other. Look at each breast for skin changes, such as: Redness. Scaly spots. Spots where your skin is  thicker. Dimpling. Open sores. Look for changes in your nipples, such as: Fluid coming out of a nipple. Fluid around a nipple. Bleeding. Dimpling. Redness. A nipple that looks pushed in or that has changed position. Feel for changes Lie on your back. Feel each breast. To do this: Pick a breast to feel. Place a pillow under the shoulder closest to that breast. Put the arm closest to that breast behind your head. Feel the breast using the hand of your other arm. Use the pads of your three middle fingers to make small circles starting near the nipple. Use light, medium, and firm pressure. Keep making circles, moving down over the breast. Stop when you feel your ribs. Start making circles with your fingers again, this time going up until you reach your collarbone. Then, make circles out across your breast and into your armpit area. Squeeze your nipple. Check for fluid and lumps. Do these steps again to check your other breast. Sit or stand in the tub or shower. With soapy water on your skin, feel each breast the same way you did when you were lying down. Write down what you find Writing down what you find can help you keep track of what you want to tell your doctor. Write down: What's normal for each breast. Any changes you find. Write down: The kind of change. If your breast feels tender or painful.  Any lump you find. Write down its size and where it is. When you last had your period. General tips If you're breastfeeding, the best time to check your breasts is after you feed your baby or after you use a breast pump. If you get a period, the best time to check your breasts is 5-7 days after your period ends. With time, you'll get more used to doing the self-exam. You'll also start to know if there are changes in your breasts. Contact a doctor if: You see a change in the shape or size of your breasts or nipples. You see a change in the skin of your breast or nipples. You have fluid  coming from your nipples that isn't normal. You find a new lump or thick area. You have breast pain. You have any concerns about your breast health. This information is not intended to replace advice given to you by your health care provider. Make sure you discuss any questions you have with your health care provider. Document Revised: 11/11/2023 Document Reviewed: 11/11/2023 Elsevier Patient Education  2025 [employer].

## 2024-05-16 ENCOUNTER — Ambulatory Visit
Admission: RE | Admit: 2024-05-16 | Discharge: 2024-05-16 | Disposition: A | Discharge status: Home / Self Care | Source: Ambulatory Visit | Attending: Emergency Medicine | Admitting: Emergency Medicine

## 2024-05-16 VITALS — BP 138/92 | HR 89 | Temp 98.6°F | Resp 18 | Wt 163.0 lb

## 2024-05-16 DIAGNOSIS — N898 Other specified noninflammatory disorders of vagina: Secondary | ICD-10-CM | POA: Insufficient documentation

## 2024-05-16 MED ORDER — LIDOCAINE HCL URETHRAL/MUCOSAL 2 % EX GEL: 1.0000 | Freq: Four times a day (QID) | CUTANEOUS | 0 refills | Status: DC | PRN

## 2024-05-16 NOTE — ED Provider Notes (Signed)
 [facility]    CSN: [id number] Arrival date & time: 05/16/24  1156      History   Chief Complaint Chief Complaint  Patient presents with   vaginal irritation     HPI Jean Mckay is a 38 y.o. female.   HPI  38 year old female with past medical history significant for anxiety, endometriosis, primary hypertension, and pelvic pain presents for evaluation of 2 days worth of vaginal irritation.  She describes it as being feeling inflamed with some itching but no burning, pain with urination, urgency, or frequency.  No vaginal discharge.  She did have unprotected sex with a known partner 2 to 3 weeks ago so she endorses a possibility of an STI.  She also reports that she used a new feminine wash that had a scent to it and she is very sensitive to scents which may have caused irritation.  She has used the brand before.  She is currently on her menstrual cycle.  Past Medical History:  Diagnosis Date   Anemia    Previous pregnancy   Anxiety    Endometriosis 2012   Gestational diabetes    controlled on diet with G2   Post partum depression 2007    Patient Active Problem List   Diagnosis Date Noted   Intractable episodic headache 10/02/2023   Multilevel spine pain 05/08/2023   Toenail deformity 05/08/2023   Dysphagia 04/03/2023   Infected abrasion of great toe of right foot 04/03/2023   Fibroids, submucosal 03/04/2023   Anemia 03/03/2023   Primary hypertension 03/03/2023   Elevated fasting glucose 03/03/2023   Pelvic pain 03/03/2023   Anxiety with depression 04/29/2022   Hx of postpartum depression 2007 06/03/2019   Smoker 5-15 cpd 06/03/2019    Past Surgical History:  Procedure Laterality Date   CESAREAN SECTION  05/16/2014   FTP   CESAREAN SECTION N/A 06/04/2017   Procedure: CESAREAN SECTION;  Surgeon: Leonce Garnette BIRCH, MD;  Location: [facility] ORS;  Service: Obstetrics;  Laterality: N/A;   WISDOM TOOTH EXTRACTION      OB History     Gravida   3   Para  3   Term  3   Preterm      AB      Living  3      SAB      IAB      Ectopic      Multiple  0   Live Births  3            Home Medications    Prior to Admission medications   Medication Sig Start Date End Date Taking? Authorizing Provider  lidocaine  (XYLOCAINE ) 2 % jelly Apply 1 Application topically every 6 (six) hours as needed. Apply to your vulva 4 times a day as needed for irritation. 05/16/24  Yes Bernardino Ditch, NP  norethindrone  (MICRONOR ) 0.35 MG tablet Take 1 tablet (0.35 mg total) by mouth daily. 04/18/24  Yes Lynda Bradley, CNM  sertraline  (ZOLOFT ) 100 MG tablet Take 1 tablet (100 mg total) by mouth daily. 02/05/24  Yes Saucier, Dorn Ruth, NP  Baclofen  5 MG TABS Take 1 tablet (5 mg total) by mouth 3 (three) times daily as needed. 10/02/23   Matthews, Jason J, MD  celecoxib  (CELEBREX ) 100 MG capsule Take 1 capsule (100 mg total) by mouth 2 (two) times daily as needed. Patient not taking: Reported on 01/20/2024 10/02/23   Alvia Selinda PARAS, MD  clotrimazole -betamethasone  (LOTRISONE ) cream Apply 1 Application topically daily. Patient  not taking: Reported on 01/20/2024 06/23/23   Tobie Franky SQUIBB, DPM  pantoprazole  (PROTONIX ) 40 MG tablet Take 1 tablet (40 mg total) by mouth daily. Patient not taking: Reported on 01/20/2024 04/03/23   Alvia Selinda PARAS, MD  cetirizine  (ZYRTEC ) 10 MG tablet Take 1 tablet (10 mg total) by mouth daily. 01/11/19 09/22/20  Cuthriell, Dorn BIRCH, PA-C    Family History Family History  Problem Relation Age of Onset   Hypertension Mother    Hypertension Father    Osteoarthritis Father    Breast cancer Maternal Grandmother 89   AAA (abdominal aortic aneurysm) Maternal Grandmother    Diabetes Maternal Grandmother    Heart disease Maternal Grandfather    Breast cancer Cousin 62   Breast cancer Cousin 41   Other Half-Brother        blood disorder runs thin   Heart failure Other     Social History Social History   Tobacco Use    Smoking status: Every Day    Current packs/day: 0.75    Average packs/day: 0.8 packs/day for 20.7 years (15.5 ttl pk-yrs)    Types: Cigarettes, Cigars    Start date: 2005    Passive exposure: Current   Smokeless tobacco: Never  Vaping Use   Vaping status: Former  Substance Use Topics   Alcohol use: Yes    Alcohol/week: 3.0 standard drinks of alcohol    Types: 3 Shots of liquor per week    Comment: q weekend--3 shots Tequila   Drug use: Not Currently    Types: Marijuana    Comment: last use age 51     Allergies   Shellfish allergy, Metronidazole , and Pork-derived products   Review of Systems Review of Systems  Genitourinary:  Positive for vaginal pain. Negative for dysuria, frequency, urgency, vaginal bleeding and vaginal discharge.     Physical Exam Triage Vital Signs ED Triage Vitals  Encounter Vitals Group     BP      Girls Systolic BP Percentile      Girls Diastolic BP Percentile      Boys Systolic BP Percentile      Boys Diastolic BP Percentile      Pulse      Resp      Temp      Temp src      SpO2      Weight      Height      Head Circumference      Peak Flow      Pain Score      Pain Loc      Pain Education      Exclude from Growth Chart    No data found.  Updated Vital Signs BP (!) 138/92 (BP Location: Left Arm)   Pulse 89   Temp 98.6 F (37 C) (Oral)   Resp 18   Wt 163 lb (73.9 kg)   LMP 05/14/2024   SpO2 98%   BMI 31.83 kg/m   Visual Acuity Right Eye Distance:   Left Eye Distance:   Bilateral Distance:    Right Eye Near:   Left Eye Near:    Bilateral Near:     Physical Exam Vitals and nursing note reviewed.  Constitutional:      Appearance: Normal appearance. She is not ill-appearing.  HENT:     Head: Normocephalic and atraumatic.  Skin:    General: Skin is warm and dry.     Capillary Refill: Capillary refill takes less than 2  seconds.     Findings: No rash.  Neurological:     General: No focal deficit present.      Mental Status: She is alert and oriented to person, place, and time.      UC Treatments / Results  Labs (all labs ordered are listed, but only abnormal results are displayed) Labs Reviewed  CERVICOVAGINAL ANCILLARY ONLY    EKG   Radiology No results found.  Procedures Procedures (including critical care time)  Medications Ordered in UC Medications - No data to display  Initial Impression / Assessment and Plan / UC Course  I have reviewed the triage vital signs and the nursing notes.  Pertinent labs & imaging results that were available during my care of the patient were reviewed by me and considered in my medical decision making (see chart for details).   Patient is a nontoxic-appearing 10 old female presenting for evaluation of vaginal irritation as outlined HPI above.  No associated vaginal discharge or UTI symptoms.  She reports that the symptoms began after she used a feminine wash.  She has used the brand before but this particular wash had a send and she is sensitive to scents per her report.  She also reports that she had increased irritation after she went into a pool that she has never been in.  She did have unprotected sex with a known partner 2 to 3 weeks prior to onset of symptoms show she does endorse a possibility of STIs.  I will order a use of vaginal cytology swab to assess for gonorrhea, chlamydia, trichomonas, bacterial vaginosis, or yeast.  I suspect that she has caused some irritation to her vaginal mucosa from the feminine wash.  I have advised the patient to stop using the wash and let her know that she really only needs to use her hand in water to clean her vaginal area.  I will prescribe some topical lidocaine  jelly that she can apply 4 times a day as needed for itching and irritation.  I have also advised her to use organic coconut oil as a vaginal moisturizer after her shower to help restore lubrication and moisture to her vestibule where she indicates she is  experiencing irritation.  If her cytology swab is positive for any infection we will contact her by phone and treatment options will be provided.  If her results are negative she should follow-up with her GYN.   Final Clinical Impressions(s) / UC Diagnoses   Final diagnoses:  Vaginal irritation     Discharge Instructions      Your cytology swab will be back in the next 1 to 2 days and if you test positive for any form of infection you will be contacted by phone and treatment options will be provided.  The irritation you are experiencing is most likely related to your new feminine wash.  I would stop using the feminine wash and tissues your hand in warm water to clean your vulva and perineum.  You may use organic coconut oil as a moisturizer for your vulva and vaginal vestibule to return moisture and help with the irritation.  I have also prescribed topical lidocaine  jelly and you can apply a small amount to your vulva 4 times a day as needed for the irritation.  If your cytology swab is negative for any form of infection I would recommend you follow-up with OB/GYN.  I have given you the contact information for [facility] if you do not have a current  OB/GYN.     ED Prescriptions     Medication Sig Dispense Auth. Provider   lidocaine  (XYLOCAINE ) 2 % jelly Apply 1 Application topically every 6 (six) hours as needed. Apply to your vulva 4 times a day as needed for irritation. 30 mL Bernardino Ditch, NP      PDMP not reviewed this encounter.   Bernardino Ditch, NP 05/16/24 1230

## 2024-05-16 NOTE — Discharge Instructions (Addendum)
 Your cytology swab will be back in the next 1 to 2 days and if you test positive for any form of infection you will be contacted by phone and treatment options will be provided.  The irritation you are experiencing is most likely related to your new feminine wash.  I would stop using the feminine wash and tissues your hand in warm water to clean your vulva and perineum.  You may use organic coconut oil as a moisturizer for your vulva and vaginal vestibule to return moisture and help with the irritation.  I have also prescribed topical lidocaine  jelly and you can apply a small amount to your vulva 4 times a day as needed for the irritation.  If your cytology swab is negative for any form of infection I would recommend you follow-up with OB/GYN.  I have given you the contact information for  [facility] if you do not have a current OB/GYN.

## 2024-05-16 NOTE — ED Triage Notes (Addendum)
 Pt presents with vaginal irritation x 2 days. She denies any urinary symptoms.

## 2024-05-17 LAB — CERVICOVAGINAL ANCILLARY ONLY
Bacterial Vaginitis (gardnerella): NEGATIVE
Candida Glabrata: NEGATIVE
Candida Vaginitis: POSITIVE — AB
Chlamydia: NEGATIVE
Comment: NEGATIVE
Comment: NEGATIVE
Comment: NEGATIVE
Comment: NEGATIVE
Comment: NEGATIVE
Comment: NORMAL
Neisseria Gonorrhea: NEGATIVE
Trichomonas: NEGATIVE

## 2024-05-18 ENCOUNTER — Ambulatory Visit (HOSPITAL_COMMUNITY): Payer: Self-pay

## 2024-05-18 MED ORDER — FLUCONAZOLE 150 MG PO TABS: 150.0000 mg | ORAL_TABLET | Freq: Once | ORAL | 0 refills | Status: AC

## 2024-05-29 ENCOUNTER — Ambulatory Visit: Payer: Self-pay | Admitting: Physician Assistant

## 2024-05-29 ENCOUNTER — Encounter: Payer: Self-pay | Admitting: Emergency Medicine

## 2024-05-29 ENCOUNTER — Ambulatory Visit (INDEPENDENT_AMBULATORY_CARE_PROVIDER_SITE_OTHER)

## 2024-05-29 ENCOUNTER — Ambulatory Visit
Admission: EM | Admit: 2024-05-29 | Discharge: 2024-05-29 | Disposition: A | Discharge status: Home / Self Care | Attending: Physician Assistant | Admitting: Physician Assistant

## 2024-05-29 DIAGNOSIS — M25561 Pain in right knee: Secondary | ICD-10-CM | POA: Diagnosis not present

## 2024-05-29 DIAGNOSIS — M25461 Effusion, right knee: Secondary | ICD-10-CM | POA: Diagnosis not present

## 2024-05-29 NOTE — Discharge Instructions (Addendum)
-  No abnormality seen on x-ray.  KNEE PAIN: Stressed avoiding painful activities . Reviewed RICE guidelines. Use medications as directed, including NSAIDs. If no NSAIDs have been prescribed for you today, you may take Aleve  or Motrin  over the counter. May use Tylenol  in between doses of NSAIDs.  If no improvement in the next 1-2 weeks, f/u with PCP or return to our office for reexamination, and please feel free to call or return at any time for any questions or concerns you may have and we will be happy to help you!

## 2024-05-29 NOTE — ED Triage Notes (Signed)
 Patient c/o right knee pain that started a week ago.  Patient denies recent injury or fall.  Patient reports some swelling in her right knee.

## 2024-05-29 NOTE — ED Provider Notes (Signed)
 [facility]    CSN: [id number] Arrival date & time: 05/29/24  1336      History   Chief Complaint Chief Complaint  Patient presents with   Knee Pain    right    HPI Jean Mckay is a 38 y.o. female running for 1 week history of atraumatic right knee pain and swelling.  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  She denies any falls or specific injury but thinks that could have contributed.  She has been taking ibuprofen  without any significant relief.  She has a soft brace that has not been helpful.  Denies any history of knee problems.  Denies any weakness or instability of the knee.  HPI  Past Medical History:  Diagnosis Date   Anemia    Previous pregnancy   Anxiety    Endometriosis 2012   Gestational diabetes    controlled on diet with G2   Post partum depression 2007    Patient Active Problem List   Diagnosis Date Noted   Intractable episodic headache 10/02/2023   Multilevel spine pain 05/08/2023   Toenail deformity 05/08/2023   Dysphagia 04/03/2023   Infected abrasion of great toe of right foot 04/03/2023   Fibroids, submucosal 03/04/2023   Anemia 03/03/2023   Primary hypertension 03/03/2023   Elevated fasting glucose 03/03/2023   Pelvic pain 03/03/2023   Anxiety with depression 04/29/2022   Hx of postpartum depression 2007 06/03/2019   Smoker 5-15 cpd 06/03/2019    Past Surgical History:  Procedure Laterality Date   CESAREAN SECTION  05/16/2014   FTP   CESAREAN SECTION N/A 06/04/2017   Procedure: CESAREAN SECTION;  Surgeon: Leonce Garnette BIRCH, MD;  Location: [facility] OR[facility];  Service: Obstetrics;  Laterality: N/A;   WISDOM TOOTH EXTRACTION      OB History     Gravida  3   Para  3   Term  3   Preterm      AB      Living  3      SAB      IAB      Ectopic      Multiple  0   Live Births  3            Home Medications    Prior  to Admission medications   Medication Sig Start Date End Date Taking? Authorizing Provider  norethindrone  (MICRONOR ) 0.35 MG tablet Take 1 tablet (0.35 mg total) by mouth daily. 04/18/24  Yes Lynda Bradley, CNM  sertraline  (ZOLOFT ) 100 MG tablet Take 1 tablet (100 mg total) by mouth daily. 02/05/24  Yes Saucier, Dorn Ruth, NP  lidocaine  (XYLOCAINE ) 2 % jelly Apply 1 Application topically every 6 (six) hours as needed. Apply to your vulva 4 times a day as needed for irritation. 05/16/24   Bernardino Ditch, NP  pantoprazole  (PROTONIX ) 40 MG tablet Take 1 tablet (40 mg total) by mouth daily. Patient not taking: Reported on 01/20/2024 04/03/23   Alvia Selinda PARAS, MD  cetirizine  (ZYRTEC ) 10 MG tablet Take 1 tablet (10 mg total) by mouth daily. 01/11/19 09/22/20  Cuthriell, Dorn BIRCH, PA-C    Family History Family History  Problem Relation Age of Onset   Hypertension Mother    Hypertension Father    Osteoarthritis Father    Breast cancer Maternal Grandmother 67   AAA (abdominal aortic aneurysm) Maternal Grandmother  Diabetes Maternal Grandmother    Heart disease Maternal Grandfather    Breast cancer Cousin 22   Breast cancer Cousin 47   Other Half-Brother        blood disorder runs thin   Heart failure Other     Social History Social History   Tobacco Use   Smoking status: Every Day    Current packs/day: 0.75    Average packs/day: 0.7 packs/day for 20.7 years (15.5 ttl pk-yrs)    Types: Cigarettes, Cigars    Start date: 2005    Passive exposure: Current   Smokeless tobacco: Never  Vaping Use   Vaping status: Former  Substance Use Topics   Alcohol use: Yes    Alcohol/week: 3.0 standard drinks of alcohol    Types: 3 Shots of liquor per week    Comment: q weekend--3 shots Tequila   Drug use: Not Currently    Types: Marijuana    Comment: last use age 15     Allergies   Shellfish allergy, Metronidazole , and Pork-derived products   Review of Systems Review of Systems   Musculoskeletal:  Positive for arthralgias and joint swelling.  Skin:  Negative for color change and wound.  Neurological:  Negative for weakness and numbness.     Physical Exam Triage Vital Signs ED Triage Vitals  Encounter Vitals Group     BP 05/29/24 1420 (!) 141/96     Girls Systolic BP Percentile --      Girls Diastolic BP Percentile --      Boys Systolic BP Percentile --      Boys Diastolic BP Percentile --      Pulse Rate 05/29/24 1420 93     Resp 05/29/24 1420 14     Temp 05/29/24 1420 98.6 F (37 C)     Temp Source 05/29/24 1420 Oral     SpO2 05/29/24 1420 100 %     Weight 05/29/24 1419 162 lb 14.7 oz (73.9 kg)     Height 05/29/24 1419 5' (1.524 m)     Head Circumference --      Peak Flow --      Pain Score 05/29/24 1418 7     Pain Loc --      Pain Education --      Exclude from Growth Chart --    No data found.  Updated Vital Signs BP (!) 141/96 (BP Location: Left Arm)   Pulse 93   Temp 98.6 F (37 C) (Oral)   Resp 14   Ht 5' (1.524 m)   Wt 162 lb 14.7 oz (73.9 kg)   LMP 05/14/2024   SpO2 100%   BMI 31.82 kg/m    Physical Exam Vitals and nursing note reviewed.  Constitutional:      General: She is not in acute distress.    Appearance: Normal appearance. She is not ill-appearing or toxic-appearing.  HENT:     Head: Normocephalic and atraumatic.  Eyes:     General: No scleral icterus.       Right eye: No discharge.        Left eye: No discharge.     Conjunctiva/sclera: Conjunctivae normal.  Cardiovascular:     Rate and Rhythm: Normal rate.  Pulmonary:     Effort: Pulmonary effort is normal. No respiratory distress.  Musculoskeletal:     Cervical back: Neck supple.     Right knee: Swelling (mild) and bony tenderness present. No deformity, erythema or ecchymosis. Normal range of motion. Tenderness  present over the medial joint line and lateral joint line.  Skin:    General: Skin is dry.  Neurological:     General: No focal deficit present.      Mental Status: She is alert. Mental status is at baseline.     Motor: No weakness.     Gait: Gait normal.  Psychiatric:        Mood and Affect: Mood normal.        Behavior: Behavior normal.      UC Treatments / Results  Labs (all labs ordered are listed, but only abnormal results are displayed) Labs Reviewed - No data to display  EKG   Radiology No results found.  Procedures Procedures (including critical care time)  Medications Ordered in UC Medications - No data to display  Initial Impression / Assessment and Plan / UC Course  I have reviewed the triage vital signs and the nursing notes.  Pertinent labs & imaging results that were available during my care of the patient were reviewed by me and considered in my medical decision making (see chart for details).   38 year old female presents for atraumatic right knee pain and swelling x 1 week.  Has a physically demanding job but denies any falls, trauma or injury.  Taking NSAIDs and using a soft brace without relief.  X-ray wet read negative.  Reviewed with patient.  Patient provided with a knee brace and encouraged to continue NSAIDs and add Tylenol , topical Voltaren and avoid painful activities.  Work note was given for tomorrow.  Reviewed return precautions.  X-ray over read negative. No change to treatment plan.    Final Clinical Impressions(s) / UC Diagnoses   Final diagnoses:  Acute pain of right knee  Swelling of right knee     Discharge Instructions      -No abnormality seen on x-ray.  KNEE PAIN: Stressed avoiding painful activities . Reviewed RICE guidelines. Use medications as directed, including NSAIDs. If no NSAIDs have been prescribed for you today, you may take Aleve  or Motrin  over the counter. May use Tylenol  in between doses of NSAIDs.  If no improvement in the next 1-2 weeks, f/u with PCP or return to our office for reexamination, and please feel free to call or return at any time for any  questions or concerns you may have and we will be happy to help you!         ED Prescriptions   None    PDMP not reviewed this encounter.   Arvis Jolan NOVAK, PA-C 05/29/24 1549

## 2024-07-14 ENCOUNTER — Ambulatory Visit
Admission: EM | Admit: 2024-07-14 | Discharge: 2024-07-14 | Disposition: A | Discharge status: Home / Self Care | Attending: Emergency Medicine | Admitting: Emergency Medicine

## 2024-07-14 DIAGNOSIS — R1031 Right lower quadrant pain: Secondary | ICD-10-CM | POA: Insufficient documentation

## 2024-07-14 DIAGNOSIS — Z113 Encounter for screening for infections with a predominantly sexual mode of transmission: Secondary | ICD-10-CM | POA: Insufficient documentation

## 2024-07-14 LAB — HIV ANTIBODY (ROUTINE TESTING W REFLEX): HIV Screen 4th Generation wRfx: NONREACTIVE

## 2024-07-14 NOTE — ED Provider Notes (Signed)
 [facility]    CSN: [id number] Arrival date & time: 07/14/24  1103      History   Chief Complaint Chief Complaint  Patient presents with   Abdominal Pain    HPI Jean Mckay is a 38 y.o. female.   38 year old female, Jean Mckay, presents to urgent care for RLQ pain intermittent x 2 days,  as well pt would like STI testing including blood work. Pt reports hx of cysts. Pt denies nausea,vomiting,discharge or new partner.Pt denies dysuria, LMP was 07/03/24  The history is provided by the patient. No language interpreter was used.    Past Medical History:  Diagnosis Date   Anemia    Previous pregnancy   Anxiety    Endometriosis 2012   Gestational diabetes    controlled on diet with G2   Post partum depression 2007    Patient Active Problem List   Diagnosis Date Noted   RLQ abdominal pain 07/14/2024   Routine screening for STI (sexually transmitted infection) 07/14/2024   Intractable episodic headache 10/02/2023   Multilevel spine pain 05/08/2023   Toenail deformity 05/08/2023   Dysphagia 04/03/2023   Infected abrasion of great toe of right foot 04/03/2023   Fibroids, submucosal 03/04/2023   Anemia 03/03/2023   Primary hypertension 03/03/2023   Elevated fasting glucose 03/03/2023   Pelvic pain 03/03/2023   Anxiety with depression 04/29/2022   Hx of postpartum depression 2007 06/03/2019   Smoker 5-15 cpd 06/03/2019    Past Surgical History:  Procedure Laterality Date   CESAREAN SECTION  05/16/2014   FTP   CESAREAN SECTION N/A 06/04/2017   Procedure: CESAREAN SECTION;  Surgeon: Leonce Garnette BIRCH, MD;  Location: [facility] ORS;  Service: Obstetrics;  Laterality: N/A;   WISDOM TOOTH EXTRACTION      OB History     Gravida  3   Para  3   Term  3   Preterm      AB      Living  3      SAB      IAB      Ectopic      Multiple  0   Live Births  3            Home Medications    Prior to Admission medications    Medication Sig Start Date End Date Taking? Authorizing Provider  sertraline  (ZOLOFT ) 100 MG tablet Take 1 tablet (100 mg total) by mouth daily. 02/05/24  Yes Saucier, Dorn Ruth, NP  lidocaine  (XYLOCAINE ) 2 % jelly Apply 1 Application topically every 6 (six) hours as needed. Apply to your vulva 4 times a day as needed for irritation. 05/16/24   Bernardino Ditch, NP  norethindrone  (MICRONOR ) 0.35 MG tablet Take 1 tablet (0.35 mg total) by mouth daily. 04/18/24   Lynda Bradley, CNM  pantoprazole  (PROTONIX ) 40 MG tablet Take 1 tablet (40 mg total) by mouth daily. Patient not taking: Reported on 01/20/2024 04/03/23   Alvia Selinda PARAS, MD  cetirizine  (ZYRTEC ) 10 MG tablet Take 1 tablet (10 mg total) by mouth daily. 01/11/19 09/22/20  Cuthriell, Dorn BIRCH, PA-C    Family History Family History  Problem Relation Age of Onset   Hypertension Mother    Hypertension Father    Osteoarthritis Father    Breast cancer Maternal Grandmother 50   AAA (abdominal aortic aneurysm) Maternal Grandmother    Diabetes Maternal Grandmother    Heart disease Maternal Grandfather    Breast cancer Cousin 40  Breast cancer Cousin 60   Other Half-Brother        blood disorder runs thin   Heart failure Other     Social History Social History   Tobacco Use   Smoking status: Every Day    Current packs/day: 0.75    Average packs/day: 0.8 packs/day for 20.8 years (15.6 ttl pk-yrs)    Types: Cigarettes, Cigars    Start date: 2005    Passive exposure: Current   Smokeless tobacco: Never  Vaping Use   Vaping status: Former  Substance Use Topics   Alcohol use: Yes    Alcohol/week: 3.0 standard drinks of alcohol    Types: 3 Shots of liquor per week    Comment: q weekend--3 shots Tequila   Drug use: Not Currently    Types: Marijuana    Comment: last use age 46     Allergies   Shellfish allergy, Metronidazole , and Porcine (pork) protein-containing drug products   Review of Systems Review of Systems   Constitutional:  Negative for fever.  Gastrointestinal:  Positive for abdominal pain. Negative for nausea and vomiting.  Genitourinary:  Negative for dysuria and vaginal discharge.  All other systems reviewed and are negative.    Physical Exam Triage Vital Signs ED Triage Vitals  Encounter Vitals Group     BP 07/14/24 1120 (!) 139/91     Girls Systolic BP Percentile --      Girls Diastolic BP Percentile --      Boys Systolic BP Percentile --      Boys Diastolic BP Percentile --      Pulse Rate 07/14/24 1120 84     Resp 07/14/24 1120 16     Temp 07/14/24 1120 98.5 F (36.9 C)     Temp Source 07/14/24 1120 Oral     SpO2 07/14/24 1120 96 %     Weight --      Height --      Head Circumference --      Peak Flow --      Pain Score 07/14/24 1118 7     Pain Loc --      Pain Education --      Exclude from Growth Chart --    No data found.  Updated Vital Signs BP (!) 139/91   Pulse 84   Temp 98.5 F (36.9 C) (Oral)   Resp 16   LMP 07/03/2024 (Approximate)   SpO2 96%   Visual Acuity Right Eye Distance:   Left Eye Distance:   Bilateral Distance:    Right Eye Near:   Left Eye Near:    Bilateral Near:     Physical Exam Vitals and nursing note reviewed.  Constitutional:      General: She is not in acute distress.    Appearance: She is well-developed.  HENT:     Head: Normocephalic and atraumatic.  Eyes:     Conjunctiva/sclera: Conjunctivae normal.  Cardiovascular:     Rate and Rhythm: Normal rate and regular rhythm.     Heart sounds: Normal heart sounds. No murmur heard. Pulmonary:     Effort: Pulmonary effort is normal. No respiratory distress.     Breath sounds: Normal breath sounds and air entry.  Abdominal:     General: Bowel sounds are normal.     Palpations: Abdomen is soft.     Tenderness: There is abdominal tenderness in the right lower quadrant. There is no guarding or rebound.     Comments: Cramp on right  side at times, denies rebound   Musculoskeletal:        General: No swelling.     Cervical back: Neck supple.  Skin:    General: Skin is warm and dry.     Capillary Refill: Capillary refill takes less than 2 seconds.  Neurological:     General: No focal deficit present.     Mental Status: She is alert and oriented to person, place, and time.     GCS: GCS eye subscore is 4. GCS verbal subscore is 5. GCS motor subscore is 6.     Sensory: Sensation is intact.     Motor: Motor function is intact.     Coordination: Coordination is intact.     Gait: Gait is intact.  Psychiatric:        Attention and Perception: Attention normal.        Mood and Affect: Mood normal.        Speech: Speech normal.        Behavior: Behavior normal. Behavior is cooperative.      UC Treatments / Results  Labs (all labs ordered are listed, but only abnormal results are displayed) Labs Reviewed  HIV ANTIBODY (ROUTINE TESTING W REFLEX)  RPR  CERVICOVAGINAL ANCILLARY ONLY    EKG   Radiology No results found.  Procedures Procedures (including critical care time)  Medications Ordered in UC Medications - No data to display  Initial Impression / Assessment and Plan / UC Course  I have reviewed the triage vital signs and the nursing notes.  Pertinent labs & imaging results that were available during my care of the patient were reviewed by me and considered in my medical decision making (see chart for details).    Discussed with pt unable to r/o appendicitis ,ovarian cyst/torsion in urgent care.  Patient has normal gait no rebound tenderness , no guarding, no fever; discussed with pt need to follow up with ER if pain worsens, nausea,vomiting,fever, check my chart for STI results.  If test results are positive patient will be notified, if results are negative will not be notified.  Patient verbalized understanding to this provider.  Ddx: RLQ abdominal pain, Routine STI testing Final Clinical Impressions(s) / UC Diagnoses   Final  diagnoses:  RLQ abdominal pain  Routine screening for STI (sexually transmitted infection)     Discharge Instructions      If you have worsening pain,nausea,vomiting or fever go to Er immediately for further evaluation  Check my chart for results. Avoid sexual activity until results,treatment known and completed. Safe sex with all future sexual activity. We have sent testing for sexually transmitted infections. We will notify you of any positive results once they are received. If required, we will prescribe any medications you might need. You will not be notified of negative results.        ED Prescriptions   None    PDMP not reviewed this encounter.   Aminta Loose, NP 07/14/24 2100

## 2024-07-14 NOTE — ED Triage Notes (Signed)
 Pt states she is having pain in her RLQ x 2 days.   Pt would like to be tested for std while here. Pt would like blood work to.

## 2024-07-14 NOTE — Discharge Instructions (Signed)
 If you have worsening pain,nausea,vomiting or fever go to Er immediately for further evaluation  Check my chart for results. Avoid sexual activity until results,treatment known and completed. Safe sex with all future sexual activity. We have sent testing for sexually transmitted infections. We will notify you of any positive results once they are received. If required, we will prescribe any medications you might need. You will not be notified of negative results.

## 2024-07-15 LAB — CERVICOVAGINAL ANCILLARY ONLY
Chlamydia: NEGATIVE
Comment: NEGATIVE
Comment: NEGATIVE
Comment: NORMAL
Neisseria Gonorrhea: NEGATIVE
Trichomonas: NEGATIVE

## 2024-07-15 LAB — RPR: RPR Ser Ql: NONREACTIVE

## 2024-08-01 ENCOUNTER — Ambulatory Visit
Admission: EM | Admit: 2024-08-01 | Discharge: 2024-08-01 | Attending: Emergency Medicine | Admitting: Emergency Medicine

## 2024-08-01 DIAGNOSIS — R079 Chest pain, unspecified: Secondary | ICD-10-CM | POA: Diagnosis not present

## 2024-08-01 NOTE — ED Notes (Signed)
 Patient is being discharged from the Urgent Care and sent to the Emergency Department via POV . Per Dr.Mortenson, patient is in need of higher level of care due to Chest Pain. Patient is aware and verbalizes understanding of plan of care.  Vitals:   08/01/24 1038  BP: (!) 146/103  Pulse: 85  Resp: 18  Temp: 99 F (37.2 C)  SpO2: 98%

## 2024-08-01 NOTE — ED Triage Notes (Addendum)
 Patient states that she's been having left upper chest pain that's stabbing for the past 3 days. Pain radiates down left arm some times. No hx of heart problems. Patient odes have anxiety. Some stressors that trigger anxiety  . Patient states that she's also been having headaches due to BP. Patient states that chest feels tight right now. Patient states that he PCP told her that she was too young to be on BP meds.

## 2024-08-01 NOTE — ED Provider Notes (Addendum)
 HPI  SUBJECTIVE:  Jean Mckay is a 38 y.o. female who presents with 3 days of intermittent sharp, stabbing left-sided chest pain, tightness that lasts seconds.  She also reports dry cough and some shortness of breath.  She reports radiation of this pain into her shoulder.  No radiation of this pain up her neck, through to her back.  No trauma to the chest, change in physical activity, rash in the area of pain.  No accompanying nausea, diaphoresis, wheezing, hemoptysis, dyspnea on exertion, vomiting, abdominal pain.  No positional or exertional component.  No surgery in the past 4 weeks, recent immobilization, exogenous estrogen, calf pain or swelling, hemoptysis.  No belching, waterbrash, burning chest pain.  She tried aspirin without improvement in her symptoms.  There are no aggravating factors.  It is not associated with torso/ arm movement, p.o. intake or exertion.  She has a past medical history of GERD with pregnancy, anxiety, is a smoker, hypertension, BMI above 30.  No history of DVT/PE, asthma, varicella, shingles, diabetes, MI, coronary disease, hypercholesterolemia, CVA, PAD/PUD.  Family history negative for early MI.    Past Medical History:  Diagnosis Date   Anemia    Previous pregnancy   Anxiety    Endometriosis 2012   Gestational diabetes    controlled on diet with G2   Post partum depression 2007    Past Surgical History:  Procedure Laterality Date   CESAREAN SECTION  05/16/2014   FTP   CESAREAN SECTION N/A 06/04/2017   Procedure: CESAREAN SECTION;  Surgeon: Leonce Garnette BIRCH, MD;  Location: [facility] OR[facility];  Service: Obstetrics;  Laterality: N/A;   WISDOM TOOTH EXTRACTION      Family History  Problem Relation Age of Onset   Hypertension Mother    Hypertension Father    Osteoarthritis Father    Breast cancer Maternal Grandmother 73   AAA (abdominal aortic aneurysm) Maternal Grandmother    Diabetes Maternal Grandmother    Heart disease Maternal  Grandfather    Breast cancer Cousin 12   Breast cancer Cousin 13   Other Half-Brother        blood disorder runs thin   Heart failure Other     Social History   Tobacco Use   Smoking status: Every Day    Current packs/day: 0.75    Average packs/day: 0.8 packs/day for 20.9 years (15.7 ttl pk-yrs)    Types: Cigarettes, Cigars    Start date: 2005    Passive exposure: Current   Smokeless tobacco: Never  Vaping Use   Vaping status: Former  Substance Use Topics   Alcohol use: Yes    Alcohol/week: 3.0 standard drinks of alcohol    Types: 3 Shots of liquor per week    Comment: q weekend--3 shots Tequila   Drug use: Not Currently    Types: Marijuana    Comment: last use age 23    No current facility-administered medications for this encounter.  Current Outpatient Medications:    sertraline  (ZOLOFT ) 100 MG tablet, Take 1 tablet (100 mg total) by mouth daily., Disp: 30 tablet, Rfl: 2   lidocaine  (XYLOCAINE ) 2 % jelly, Apply 1 Application topically every 6 (six) hours as needed. Apply to your vulva 4 times a day as needed for irritation., Disp: 30 mL, Rfl: 0   norethindrone  (MICRONOR ) 0.35 MG tablet, Take 1 tablet (0.35 mg total) by mouth daily., Disp: 30 tablet, Rfl: 0   pantoprazole  (PROTONIX ) 40 MG tablet, Take 1 tablet (40 mg total) by  mouth daily. (Patient not taking: Reported on 01/20/2024), Disp: 60 tablet, Rfl: 0  Allergies  Allergen Reactions   Shellfish Allergy Swelling    Feeling tightness and irritation in throat   Metronidazole  Rash    June 2015 June 2015   Porcine (Pork) Protein-Containing Drug Products Diarrhea and Rash     ROS  As noted in HPI.   Physical Exam  BP (!) 146/103 (BP Location: Left Arm)   Pulse 85   Temp 99 F (37.2 C) (Oral)   Resp 18   Wt 75.8 kg   LMP 07/29/2024 (Approximate)   SpO2 98%   BMI 32.61 kg/m  BP Readings from Last 3 Encounters:  08/01/24 (!) 146/103  07/14/24 (!) 139/91  05/29/24 (!) 141/96    Constitutional: Well  developed, well nourished, no acute distress Eyes: PERRL, EOMI, conjunctiva normal bilaterally HENT: Normocephalic, atraumatic,mucus membranes moist Respiratory: Clear to auscultation bilaterally, no rales, no wheezing, no rhonchi.  Positive left-sided chest wall tenderness that reproduces the sharp pain. Cardiovascular: Normal rate and rhythm, no murmurs, no gallops, no rubs GI: nondistended skin: No rash or bruising over the chest, skin intact Musculoskeletal: Calf symmetric, nontender, no edema Neurologic: Alert & oriented x 3, CN III-XII grossly intact, no motor deficits, sensation grossly intact Psychiatric: Speech and behavior appropriate   ED Course   Medications - No data to display  Orders Placed This Encounter  Procedures   EKG 12-Lead    Standing Status:   Standing    Number of Occurrences:   1   No results found for this or any previous visit (from the past 24 hours). No results found.  ED Clinical Impression  1. Chest pain, unspecified type      ED Assessment/Plan     EKG: Normal sinus rhythm, rate 75.  Normal axis, normal intervals.  No hypertrophy no ST-T wave changes.  No change compared to EKG on 05/2020.  Patient symptomatic while EKG was obtained.  Patient is PERC negative.  Low suspicion for PE.  HEART score:  History: Moderately suspicious +1 EKG: Normal 0 Age: Below 45 0 Risk factors: 3-BMI above 30, smoking, hypertension +2 Troponin: Not available Total score 3.  Patient is at low risk for 30-day MACE.  Discussed this with patient.  I suspect musculoskeletal chest pain, but discussed with patient that the safest thing to do would be to go to the emergency department to rule out ACS as we no longer have the tools to do so.  However, I believe that she is low risk/stable enough that she can go via private vehicle.  Patient has opted and agrees to go to the emergency department.  She will go to [facility].  No orders of the defined types were placed in  this encounter.     *This clinic note was created using Dragon dictation software. Therefore, there may be occasional mistakes despite careful proofreading. ?    Van Knee, MD 08/01/24 1111    Van Knee, MD 08/01/24 [phone]

## 2024-08-01 NOTE — Discharge Instructions (Signed)
 I am sending you to the emergency department to rule out acute coronary syndrome.  Go there now.  Let them know if your pain changes, gets worse, or for any concerns.

## 2024-08-13 ENCOUNTER — Encounter: Payer: Self-pay | Admitting: Emergency Medicine

## 2024-08-13 ENCOUNTER — Ambulatory Visit
Admission: EM | Admit: 2024-08-13 | Discharge: 2024-08-13 | Disposition: A | Discharge status: Home / Self Care | Attending: Family Medicine | Admitting: Family Medicine

## 2024-08-13 ENCOUNTER — Ambulatory Visit

## 2024-08-13 DIAGNOSIS — S93402A Sprain of unspecified ligament of left ankle, initial encounter: Secondary | ICD-10-CM

## 2024-08-13 MED ORDER — KETOROLAC TROMETHAMINE 30 MG/ML IJ SOLN
30.0000 mg | Freq: Once | INTRAMUSCULAR | Status: AC
  Administered 2024-08-13: 30 mg via INTRAMUSCULAR

## 2024-08-13 NOTE — Discharge Instructions (Addendum)
 Keep your ankle elevated is much as possible to help decrease swelling and aid in healing.  Apply moist heat to your ankle for 20 minutes at a time to help improve blood flow which will bring fresh oxygen and nutrients to the ligaments and help facilitate the removal of metabolic byproducts from inflammation.  Take over-the-counter ibuprofen , 600 mg (3 tablets) every 6 hours with food to help with inflammation and pain.  Wear the Aircast ankle brace when up and moving.  You may take it off at nighttime, when bathing, and when not walking on her ankle.  Use the crutches to assist you with ambulation.  Follow the rehabilitation exercises given your discharge instructions.  Wait to start the phase 1 exercises until 48 hours after your injury to give time for the inflammation to go down.  Progress to phase 2 after you can complete phase 1 with out any significant pain.   If your symptoms do not improve or recommend you follow-up with orthopedics such as [facility] here in [place] or in [place].

## 2024-08-13 NOTE — ED Triage Notes (Signed)
 Patient states that she fell outside her building this morning and injured her left ankle.  Patient c/o pain in her left ankle.

## 2024-08-13 NOTE — ED Provider Notes (Signed)
 [facility]    CSN: [id number] Arrival date & time: 08/13/24  1025      History   Chief Complaint Chief Complaint  Patient presents with   Fall   Ankle Pain    left    HPI Jean Mckay is a 38 y.o. female.   HPI  38 year old female with past medical history significant for primary hypertension, submucosal fibroids, anxiety and depression presents for evaluation of pain to the outside of her left ankle.  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  She states she might have been a little dizzy but she is not dizzy at present.  She is able to bear weight but with significant pain.  She is also reports numbness and tingling in her toes.  She does not take anything for her pain.  Past Medical History:  Diagnosis Date   Anemia    Previous pregnancy   Anxiety    Endometriosis 2012   Gestational diabetes    controlled on diet with G2   Post partum depression 2007    Patient Active Problem List   Diagnosis Date Noted   RLQ abdominal pain 07/14/2024   Routine screening for STI (sexually transmitted infection) 07/14/2024   Intractable episodic headache 10/02/2023   Multilevel spine pain 05/08/2023   Toenail deformity 05/08/2023   Dysphagia 04/03/2023   Infected abrasion of great toe of right foot 04/03/2023   Fibroids, submucosal 03/04/2023   Anemia 03/03/2023   Primary hypertension 03/03/2023   Elevated fasting glucose 03/03/2023   Pelvic pain 03/03/2023   Anxiety with depression 04/29/2022   Hx of postpartum depression 2007 06/03/2019   Smoker 5-15 cpd 06/03/2019    Past Surgical History:  Procedure Laterality Date   CESAREAN SECTION  05/16/2014   FTP   CESAREAN SECTION N/A 06/04/2017   Procedure: CESAREAN SECTION;  Surgeon: Leonce Garnette BIRCH, MD;  Location: [facility] ORS;  Service: Obstetrics;  Laterality: N/A;   WISDOM TOOTH EXTRACTION      OB History     Gravida  3   Para  3    Term  3   Preterm      AB      Living  3      SAB      IAB      Ectopic      Multiple  0   Live Births  3            Home Medications    Prior to Admission medications   Medication Sig Start Date End Date Taking? Authorizing Provider  sertraline  (ZOLOFT ) 100 MG tablet Take 1 tablet (100 mg total) by mouth daily. 02/05/24  Yes Saucier, Dorn Ruth, NP  lidocaine  (XYLOCAINE ) 2 % jelly Apply 1 Application topically every 6 (six) hours as needed. Apply to your vulva 4 times a day as needed for irritation. 05/16/24   Bernardino Ditch, NP  norethindrone  (MICRONOR ) 0.35 MG tablet Take 1 tablet (0.35 mg total) by mouth daily. 04/18/24   Lynda Bradley, CNM  pantoprazole  (PROTONIX ) 40 MG tablet Take 1 tablet (40 mg total) by mouth daily. Patient not taking: Reported on 01/20/2024 04/03/23   Alvia Selinda PARAS, MD  cetirizine  (ZYRTEC ) 10 MG tablet Take 1 tablet (10 mg total) by mouth daily. 01/11/19 09/22/20  Cuthriell, Dorn BIRCH, PA-C    Family History Family History  Problem Relation Age of Onset   Hypertension Mother  Hypertension Father    Osteoarthritis Father    Breast cancer Maternal Grandmother 76   AAA (abdominal aortic aneurysm) Maternal Grandmother    Diabetes Maternal Grandmother    Heart disease Maternal Grandfather    Breast cancer Cousin 7   Breast cancer Cousin 69   Other Half-Brother        blood disorder runs thin   Heart failure Other     Social History Social History   Tobacco Use   Smoking status: Every Day    Current packs/day: 0.75    Average packs/day: 0.8 packs/day for 20.9 years (15.7 ttl pk-yrs)    Types: Cigarettes, Cigars    Start date: 2005    Passive exposure: Current   Smokeless tobacco: Never  Vaping Use   Vaping status: Former  Substance Use Topics   Alcohol use: Yes    Alcohol/week: 3.0 standard drinks of alcohol    Types: 3 Shots of liquor per week    Comment: q weekend--3 shots Tequila   Drug use: Not Currently    Types:  Marijuana    Comment: last use age 67     Allergies   Shellfish allergy, Metronidazole , and Porcine (pork) protein-containing drug products   Review of Systems Review of Systems  Constitutional:  Negative for fever.  Musculoskeletal:  Positive for arthralgias and joint swelling.  Skin:  Negative for color change.  Neurological:  Positive for numbness.     Physical Exam Triage Vital Signs ED Triage Vitals  Encounter Vitals Group     BP      Girls Systolic BP Percentile      Girls Diastolic BP Percentile      Boys Systolic BP Percentile      Boys Diastolic BP Percentile      Pulse      Resp      Temp      Temp src      SpO2      Weight      Height      Head Circumference      Peak Flow      Pain Score      Pain Loc      Pain Education      Exclude from Growth Chart    No data found.  Updated Vital Signs BP (!) 152/97 (BP Location: Left Arm)   Pulse 97   Temp 98.2 F (36.8 C) (Oral)   Resp 14   Ht 5' (1.524 m)   Wt 167 lb 1.7 oz (75.8 kg)   LMP 07/29/2024 (Approximate)   SpO2 100%   BMI 32.64 kg/m   Visual Acuity Right Eye Distance:   Left Eye Distance:   Bilateral Distance:    Right Eye Near:   Left Eye Near:    Bilateral Near:     Physical Exam Vitals and nursing note reviewed.  Constitutional:      Appearance: Normal appearance. She is not ill-appearing.  HENT:     Head: Normocephalic and atraumatic.  Musculoskeletal:        General: Swelling, tenderness and signs of injury present. No deformity.  Skin:    General: Skin is warm and dry.     Capillary Refill: Capillary refill takes less than 2 seconds.     Findings: No bruising or erythema.  Neurological:     General: No focal deficit present.     Mental Status: She is alert and oriented to person, place, and time.  UC Treatments / Results  Labs (all labs ordered are listed, but only abnormal results are displayed) Labs Reviewed - No data to display  EKG   Radiology DG  Ankle Complete Left Result Date: 08/13/2024 CLINICAL DATA:  Fall today.  Left ankle pain. EXAM: LEFT ANKLE COMPLETE - 3+ VIEW COMPARISON:  None Available. FINDINGS: There is no evidence of fracture, dislocation, or joint effusion. There is no evidence of arthropathy or other focal bone abnormality. Soft tissues are unremarkable. IMPRESSION: Negative. Electronically Signed   By: Alm Parkins M.D.   On: 08/13/2024 12:26    Procedures Procedures (including critical care time)  Medications Ordered in UC Medications  ketorolac  (TORADOL ) 30 MG/ML injection 30 mg (30 mg Intramuscular Given 08/13/24 1155)    Initial Impression / Assessment and Plan / UC Course  I have reviewed the triage vital signs and the nursing notes.  Pertinent labs & imaging results that were available during my care of the patient were reviewed by me and considered in my medical decision making (see chart for details).   Patient is a pleasant 38 year old female presenting for evaluation of pain in her proximal lateral midfoot as well as lateral malleolus after suffering a ground-level fall earlier today.  Her DP and PT pulses are 2+ and she is able to wiggle her toes.  She has sensation in her toes with palpation.  No tenderness in her phalanges or metatarsals.  She does have tenderness with palpation of the proximal lateral midfoot as well as the lateral malleolus.  No visible crepitus.  There is visible edema but no ecchymosis or erythema.  The remainder the patient's foot is benign.  She has not taken anything for pain today and I offered her the choice of an injection versus oral medication and she said she did not care.  She has no history of kidney issues and CMP from January of this year shows normal renal function.  I will therefore order 30 mg of IM Toradol  for the patient's pain and obtain a radiograph of the left ankle to evaluate for bony injury.  Left ankle x-rays independently reviewed and evaluated by me.   Impression: No evidence of fracture or dislocation noted.  Ankle mortise joint is well-maintained.  There is soft tissue swelling over the proximal lateral midfoot.  Radiology overread is pending. Radiology impression states no evidence of fracture, dislocation, or joint effusion.  I will discharge patient on the diagnosis of left ankle sprain with an Aircast for support and crutches to assist with ambulation.  She may use over-the-counter ibuprofen  or Aleve  as needed for pain and inflammation.  She should keep her foot elevated is much as possible decrease swelling and aid in pain relief.  She may also apply ice to her foot and ankle for 20 minutes at a time, 2-3 times a day, to with pain and inflammation.  Home physical therapy exercises provided.   Final Clinical Impressions(s) / UC Diagnoses   Final diagnoses:  Sprain of left ankle, unspecified ligament, initial encounter     Discharge Instructions      Keep your ankle elevated is much as possible to help decrease swelling and aid in healing.  Apply moist heat to your ankle for 20 minutes at a time to help improve blood flow which will bring fresh oxygen and nutrients to the ligaments and help facilitate the removal of metabolic byproducts from inflammation.  Take over-the-counter ibuprofen , 600 mg (3 tablets) every 6 hours with food to help  with inflammation and pain.  Wear the Aircast ankle brace when up and moving.  You may take it off at nighttime, when bathing, and when not walking on her ankle.  Use the crutches to assist you with ambulation.  Follow the rehabilitation exercises given your discharge instructions.  Wait to start the phase 1 exercises until 48 hours after your injury to give time for the inflammation to go down.  Progress to phase 2 after you can complete phase 1 with out any significant pain.   If your symptoms do not improve or recommend you follow-up with orthopedics such as [facility] here in [place] or in  [place].     ED Prescriptions   None    PDMP not reviewed this encounter.   Bernardino Ditch, NP 08/13/24 1234

## 2024-08-29 ENCOUNTER — Other Ambulatory Visit: Payer: Self-pay | Admitting: Advanced Practice Midwife

## 2024-08-29 DIAGNOSIS — N924 Excessive bleeding in the premenopausal period: Secondary | ICD-10-CM

## 2024-08-29 DIAGNOSIS — N946 Dysmenorrhea, unspecified: Secondary | ICD-10-CM

## 2024-08-30 ENCOUNTER — Encounter: Payer: Self-pay | Admitting: Family Medicine

## 2024-08-30 ENCOUNTER — Ambulatory Visit: Admitting: Family Medicine

## 2024-08-30 VITALS — BP 120/80 | HR 102 | Ht 60.0 in | Wt 168.0 lb

## 2024-08-30 DIAGNOSIS — N946 Dysmenorrhea, unspecified: Secondary | ICD-10-CM

## 2024-08-30 DIAGNOSIS — N924 Excessive bleeding in the premenopausal period: Secondary | ICD-10-CM

## 2024-08-30 MED ORDER — NORETHINDRONE 0.35 MG PO TABS: 1.0000 | ORAL_TABLET | Freq: Every day | ORAL | 11 refills | Status: AC

## 2024-09-02 DIAGNOSIS — S93409A Sprain of unspecified ligament of unspecified ankle, initial encounter: Secondary | ICD-10-CM | POA: Insufficient documentation

## 2024-09-02 NOTE — Progress Notes (Signed)
 "    Primary Care / Sports Medicine Office Visit  Patient Information:  Patient ID: Jean Mckay, female DOB: 08/16/1986 Age: 38 y.o. MRN: [id number]   Jean Mckay is a pleasant 38 y.o. female presenting with the following:  Chief Complaint  Patient presents with   Ankle Injury    Left ankle injury on 08/13/24. Patient fell out of her storage unit and twisted her left ankle. Patient went to [facility] for initial injury. She got xray's and a brace. Brace has not helped. She has not tried medications or PT. She has sharp pain in her toes and shooting up her leg.     Vitals:   08/30/24 1459  BP: 120/80  Pulse: (!) 102  SpO2: 96%   Vitals:   08/30/24 1459  Weight: 168 lb (76.2 kg)  Height: 5' (1.524 m)   Body mass index is 32.81 kg/m.  DG Ankle Complete Left Result Date: 08/13/2024 CLINICAL DATA:  Fall today.  Left ankle pain. EXAM: LEFT ANKLE COMPLETE - 3+ VIEW COMPARISON:  None Available. FINDINGS: There is no evidence of fracture, dislocation, or joint effusion. There is no evidence of arthropathy or other focal bone abnormality. Soft tissues are unremarkable. IMPRESSION: Negative. Electronically Signed   By: Alm Parkins M.D.   On: 08/13/2024 12:26     Discussed the use of AI scribe software for clinical note transcription with the patient, who gave verbal consent to proceed.   Independent interpretation of notes and tests performed by another provider:   Results Radiology Left ankle x-ray (08/13/2024): No fracture. Mild calcific change at Achilles tendon insertion. No acute bony abnormality. No findings to explain lateral or anterior ankle pain. No effusion. (Independently interpreted)  Procedures performed:   None  Pertinent History, Exam, Impression, and Recommendations:   Problem List Items Addressed This Visit     Grade 2 ankle sprain - Primary   History of Present Illness Jean Mckay is a 38 year old female who presents  with persistent left ankle pain and swelling following a grade 2 lateral ankle sprain.  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 - Immediate, significant pain following injury; unable to recall how she returned inside. - Presented to urgent care the same day; radiographs obtained, placed in a brace. - Advised to obtain MRI, not yet completed. - Persistent pain and swelling for three weeks, preventing use of a shoe on the affected foot. - Attempts to wear a soft, low-profile shoe increase pain and require icing. - Ongoing ecchymosis, particularly last week, with discoloration and sensation of bruise being stuck right up in there. - Pain localized to lateral and anterior aspects of the left ankle, radiating to the dorsal aspect of the toe. - Analgesia with ibuprofen .  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 - Initially used crutches for two days and missed four days of work. - Employer provided two weeks of paid leave, which is nearing expiration. - Considering FMLA due to persistent symptoms and concerns about ability to return to work.  Left knee pain and swelling - Swelling and discomfort in the left knee, attributed to altered gait and increased pressure from favoring the right side. - Resumed use of a knee brace for support. - Symptoms exacerbated by activity and weight-bearing.  Physical Exam LEFT ANKLE  INSPECTION: No ecchymosis, effusion, or swelling. Normal alignment. PALPATION: Non-tender at the fibular head, medial and lateral malleoli, deltoid ligament, Achilles insertion, and base of the fifth metatarsal. Localized tenderness at the anterior ankle joint line. Tenderness at the anterior talofibular ligament (ATFL), calcaneofibular ligament (CFL), and maximal tenderness at the  posterior talofibular ligament (PTFL). RANGE OF MOTION: Dorsiflexion and plantarflexion preserved with mild discomfort at end-range dorsiflexion. Inversion and eversion intact. STRENGTH: 5/5 strength in dorsiflexion, plantarflexion, inversion, and eversion with mild pain on resisted inversion. NEUROLOGICAL: Sensation intact to light touch throughout the left foot and ankle; no focal motor deficit. SPECIAL TESTS: Negative talar tilt test. Negative anterior and posterior drawer tests.  Assessment and Plan Grade 2 lateral ankle sprain, left Subacute grade 2 lateral ankle sprain involving ATFL, CFL, and PTFL with persistent pain, swelling, and functional limitation three weeks post-injury. No fracture on x-ray. Emphasized need for immobilization and rehabilitation to prevent chronic instability and weakness. - Reviewed left ankle x-rays confirming absence of fracture. - Placed in walking boot for immobilization for remainder of week one, to be worn day and night except for sleep and showering. - Instructed to begin weaning from boot inside the home starting this weekend (week two), using boot less as tolerated, but to continue wearing boot outside the home for the full second week. - Advised transition from boot to ankle brace after week two, with goal to discontinue boot by week four. - Prescribed diclofenac twice daily as needed for pain and swelling, especially during initial weeks and as she begins exercises. - Instructed to begin home ankle rehabilitation exercises (to be sent via MyChart) starting week two. - Scheduled follow-up visit in four weeks to reassess ankle recovery and functional status. - Provided work note for four weeks of light duty until medically cleared. - Discussed FMLA paperwork and advised to coordinate with HR for job protection during recovery. - Scheduled additional visit for physical exam and lab work to address general health and vitamin status, as requested.  Left knee  pain and swelling Chronic left knee pain and swelling, likely exacerbated by altered gait and increased load due to left ankle injury. - Advised continued use of knee brace as needed for support. - Discussed that improvement may occur as ankle function improves and gait normalizes. - Planned to reassess knee symptoms at upcoming follow-up and physical exam.      Other Visit Diagnoses       Dysmenorrhea       Relevant Medications   norethindrone  (MICRONOR ) 0.35 MG tablet     Menorrhagia, premenopausal       Relevant Medications   norethindrone  (MICRONOR ) 0.35 MG tablet        Orders & Medications Medications:  Meds ordered this encounter  Medications   norethindrone  (MICRONOR ) 0.35 MG tablet    Sig: Take 1 tablet (0.35 mg total) by mouth daily.    Dispense:  30 tablet    Refill:  11   No orders of the defined types were placed in this encounter.    No follow-ups on file.     Selinda JINNY Ku, MD, [facility]   Primary Care Sports Medicine Primary Care and Sports Medicine at [facility]   "

## 2024-09-02 NOTE — Assessment & Plan Note (Signed)
 History of Present Illness Jean Mckay is a 38 year old female who presents with persistent left ankle pain and swelling following a grade 2 lateral ankle sprain.  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 - Immediate, significant pain following injury; unable to recall how she returned inside. - Presented to urgent care the same day; radiographs obtained, placed in a brace. - Advised to obtain MRI, not yet completed. - Persistent pain and swelling for three weeks, preventing use of a shoe on the affected foot. - Attempts to wear a soft, low-profile shoe increase pain and require icing. - Ongoing ecchymosis, particularly last week, with discoloration and sensation of bruise being stuck right up in there. - Pain localized to lateral and anterior aspects of the left ankle, radiating to the dorsal aspect of the toe. - Analgesia with ibuprofen .  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 - Initially used crutches for two days and missed four days of work. - Employer provided two weeks of paid leave, which is nearing expiration. - Considering FMLA due to persistent symptoms and concerns about ability to return to work.  Left knee pain and swelling - Swelling and discomfort in the left knee, attributed to altered gait and increased pressure from favoring the right side. - Resumed use of a knee brace for support. - Symptoms exacerbated by activity and weight-bearing.  Physical Exam LEFT ANKLE INSPECTION: No ecchymosis, effusion, or swelling. Normal alignment. PALPATION: Non-tender at the fibular head, medial and lateral malleoli, deltoid ligament, Achilles insertion, and base of the fifth metatarsal. Localized tenderness at the anterior ankle joint line. Tenderness at the  anterior talofibular ligament (ATFL), calcaneofibular ligament (CFL), and maximal tenderness at the posterior talofibular ligament (PTFL). RANGE OF MOTION: Dorsiflexion and plantarflexion preserved with mild discomfort at end-range dorsiflexion. Inversion and eversion intact. STRENGTH: 5/5 strength in dorsiflexion, plantarflexion, inversion, and eversion with mild pain on resisted inversion. NEUROLOGICAL: Sensation intact to light touch throughout the left foot and ankle; no focal motor deficit. SPECIAL TESTS: Negative talar tilt test. Negative anterior and posterior drawer tests.  Assessment and Plan Grade 2 lateral ankle sprain, left Subacute grade 2 lateral ankle sprain involving ATFL, CFL, and PTFL with persistent pain, swelling, and functional limitation three weeks post-injury. No fracture on x-ray. Emphasized need for immobilization and rehabilitation to prevent chronic instability and weakness. - Reviewed left ankle x-rays confirming absence of fracture. - Placed in walking boot for immobilization for remainder of week one, to be worn day and night except for sleep and showering. - Instructed to begin weaning from boot inside the home starting this weekend (week two), using boot less as tolerated, but to continue wearing boot outside the home for the full second week. - Advised transition from boot to ankle brace after week two, with goal to discontinue boot by week four. - Prescribed diclofenac  twice daily as needed for pain and swelling, especially during initial weeks and as she begins exercises. - Instructed to begin home ankle rehabilitation exercises (to be sent via MyChart) starting week two. - Scheduled follow-up visit in four weeks to reassess ankle recovery and functional status. - Provided work note for four weeks of light duty until medically cleared. - Discussed FMLA paperwork and advised to coordinate with HR for job protection during recovery. - Scheduled additional visit  for  physical exam and lab work to address general health and vitamin status, as requested.  Left knee pain and swelling Chronic left knee pain and swelling, likely exacerbated by altered gait and increased load due to left ankle injury. - Advised continued use of knee brace as needed for support. - Discussed that improvement may occur as ankle function improves and gait normalizes. - Planned to reassess knee symptoms at upcoming follow-up and physical exam.

## 2024-09-02 NOTE — Patient Instructions (Signed)
 VISIT SUMMARY:  You visited us  today due to persistent pain and swelling in your left ankle following a sprain, as well as discomfort in your left knee. We discussed your treatment plan, including immobilization, medication, and rehabilitation exercises to aid your recovery.  YOUR PLAN:  GRADE 2 LATERAL ANKLE SPRAIN, LEFT: You have a grade 2 sprain in your left ankle, which involves several ligaments and is causing ongoing pain and swelling. -Continue wearing the walking boot day and night except for sleep and showering for the rest of this week. -Start weaning off the boot inside your home this weekend, but continue wearing it outside for the full second week. -Transition from the boot to an ankle brace after the second week, aiming to stop using the boot by week four. -Take diclofenac  twice daily as needed for pain and swelling. -Begin home ankle rehabilitation exercises starting in the second week. Instructions will be sent via MyChart. -Follow up in four weeks to reassess your ankle recovery and functional status. -You have a work note for four weeks of light duty until you are medically cleared. -Coordinate with HR for FMLA paperwork to ensure job protection during your recovery.  LEFT KNEE PAIN AND SWELLING: Your left knee pain and swelling are likely due to the altered way you are walking because of your ankle injury. -Continue using the knee brace as needed for support. -We will reassess your knee symptoms at your follow-up visit.

## 2024-09-05 ENCOUNTER — Encounter: Payer: Self-pay | Admitting: Family Medicine

## 2024-09-05 ENCOUNTER — Other Ambulatory Visit: Payer: Self-pay | Admitting: Family Medicine

## 2024-09-05 MED ORDER — DICLOFENAC SODIUM 75 MG PO TBEC: 75.0000 mg | DELAYED_RELEASE_TABLET | Freq: Two times a day (BID) | ORAL | 0 refills | Status: DC | PRN

## 2024-09-05 NOTE — Telephone Encounter (Signed)
 Please review and advise.  JM

## 2024-09-12 ENCOUNTER — Encounter: Payer: Self-pay | Admitting: Family Medicine

## 2024-09-12 ENCOUNTER — Ambulatory Visit: Admitting: Family Medicine

## 2024-09-12 VITALS — BP 126/92 | HR 100 | Ht 60.0 in | Wt 170.0 lb

## 2024-09-12 DIAGNOSIS — Z0000 Encounter for general adult medical examination without abnormal findings: Secondary | ICD-10-CM

## 2024-09-12 DIAGNOSIS — S93409A Sprain of unspecified ligament of unspecified ankle, initial encounter: Secondary | ICD-10-CM

## 2024-09-12 DIAGNOSIS — F418 Other specified anxiety disorders: Secondary | ICD-10-CM | POA: Diagnosis not present

## 2024-09-12 MED ORDER — DICLOFENAC SODIUM 75 MG PO TBEC: 75.0000 mg | DELAYED_RELEASE_TABLET | Freq: Two times a day (BID) | ORAL | 0 refills | Status: AC | PRN

## 2024-09-12 MED ORDER — CARIPRAZINE HCL 1.5 MG PO CAPS: 1.5000 mg | ORAL_CAPSULE | Freq: Every day | ORAL | 0 refills | Status: AC

## 2024-09-12 NOTE — Patient Instructions (Addendum)
 VISIT SUMMARY:  You had a follow-up visit to check on your ankle sprain and discuss your mood management. Your ankle is improving, and you are gradually transitioning out of the walking boot. We also adjusted your medication for mood disorder to better manage your symptoms.  YOUR PLAN:  LEFT ANKLE SPRAIN: You have a right ankle sprain that is showing improvement. You experienced a 'pop' that released stiffness, and you are feeling more comfortable walking. -Gradually increase the time you spend out of the walking boot at home as you can tolerate it. -Continue using the boot for activities outside the home or when you need extra support. -Use diclofenac  for pain as needed, especially if you experience increased activity or soreness. -Consider using an ankle compression sleeve or Ace wrap for support when transitioning out of the boot and resuming wearing shoes. -Your follow-up visit is scheduled for January 9th to reassess your progress.  MOOD DISORDER: We are adjusting your medication to better manage both depressive and manic symptoms. -Start taking Vraylar (cariprazine) at the lowest dose along with your current dose of sertraline . -Vraylar may cause stomach upset when you start taking it; take it with food if this happens. These side effects usually go away after a few days. -Notify our office if you have any issues with insurance coverage for Vraylar so we can consider alternatives. -Take hydroxyzine  (Vistaril ) as needed for acute anxiety episodes. It is non-addictive and can help during periods of heightened anxiety. -Continue taking sertraline  at your current dose. We are not planning to increase it due to your mood variability and possible bipolar diagnosis. -Stay engaged with your therapy and inform your therapist about your new diagnosis and medication changes. -We ordered lab tests today to get a baseline before adjusting your medication. -Follow up in 4-6 weeks, or sooner via video  visit if needed, to assess your response to Vraylar and overall mood stability.

## 2024-09-12 NOTE — Progress Notes (Signed)
 "    Primary Care / Sports Medicine Office Visit  Patient Information:  Patient ID: Jean Mckay, female DOB: [birth year] Age: 38 y.o. MRN: [id number]   Chevy Virgo is a pleasant 38 y.o. female presenting with the following:  Chief Complaint  Patient presents with   Follow-up    Vitals:   09/12/24 1611  BP: (!) 126/92  Pulse: 100  SpO2: 98%   Vitals:   09/12/24 1611  Weight: 170 lb (77.1 kg)  Height: 5' (1.524 m)   Body mass index is 33.2 kg/m.  No results found.   Discussed the use of AI scribe software for clinical note transcription with the patient, who gave verbal consent to proceed.   Independent interpretation of notes and tests performed by another provider:   None  Procedures performed:   None  Pertinent History, Exam, Impression, and Recommendations:   History of Present Illness Jean Mckay is a 38 year old female with right ankle sprain who presents for follow-up of her injury.  Right ankle pain and function - Right ankle sprain since December 16th - Significant improvement in symptoms since injury - Experienced a 'pop' in the ankle over the weekend, resulting in release of stiffness in a previously immobile area - Persistent localized soreness along the lateral aspect of the ankle - Increased comfort and confidence with ambulation - No new injuries, swelling, or signs of infection  Mobility and assistive devices - Using a walking boot for right ankle since December 16th - Gradually increasing time out of the boot at home - Continues to use the boot for activities outside the home, such as grocery shopping - Inquires about use of ankle compression sleeves for additional support during transition out of the boot  Pain management - Has not started diclofenac  for pain due to pharmacy issues - Willing to use diclofenac  as needed once available  Functional status and disability - Remains on short-term  disability due to ankle injury - Disability paperwork submitted to HR last week  Assessment and Plan Right ankle sprain Subacute left ankle sprain with ongoing improvement. Responded well to walking boot with reduced stiffness and increased mobility. Residual lateral ankle soreness improving. - Advised gradual increase in time out of the boot at home as tolerated, with continued boot use for activities outside the home or when increased support is needed. - Prescribed diclofenac  for use as needed for pain, particularly with increased activity or soreness during rehabilitation. - Recommended ankle compression sleeve or Ace wrap for support when transitioning out of the boot and resuming shoe wear. - Confirmed follow-up visit scheduled for January 9th to reassess progress and adjust management.  Mood disorder  Variable response and side effects with sertraline . Adjusting management to address depressive and manic symptoms. - Prescribed Vraylar (cariprazine), lowest dose, as adjunct to sertraline  for mood stabilization. - Discussed that Vraylar targets both depressive and manic symptoms and is more appropriate for bipolar disorder than SSRI monotherapy. - Advised that Vraylar may cause gastrointestinal upset when initiated; recommended taking with food if this occurs and reassured that side effects typically resolve after a few days. - Instructed her to notify the office regarding any insurance coverage issues with Vraylar so alternatives can be considered. - Prescribed hydroxyzine  (Vistaril ) as needed for acute anxiety episodes, emphasizing its non-addictive profile and use during periods of heightened anxiety. - Reinforced continuation of sertraline  at current dose, with no increase planned due to mood variability and possible bipolar diagnosis. - Encouraged ongoing engagement  with therapy and to update therapist regarding new diagnosis and medication changes. - Ordered laboratory studies today  for baseline assessment prior to medication adjustment. - Planned follow-up in 4-6 weeks, or sooner via video visit if needed, to assess response to Vraylar and overall mood stability.  Problem List Items Addressed This Visit     Anxiety with depression   Grade 2 ankle sprain   Other Visit Diagnoses       Healthcare maintenance    -  Primary   Relevant Orders   CBC   Comprehensive metabolic panel with GFR   Hemoglobin A1c   Lipid panel        Orders & Medications Medications:  Meds ordered this encounter  Medications   diclofenac  (VOLTAREN ) 75 MG EC tablet    Sig: Take 1 tablet (75 mg total) by mouth 2 (two) times daily as needed.    Dispense:  60 tablet    Refill:  0   cariprazine (VRAYLAR) 1.5 MG capsule    Sig: Take 1 capsule (1.5 mg total) by mouth daily.    Dispense:  90 capsule    Refill:  0   Orders Placed This Encounter  Procedures   CBC   Comprehensive metabolic panel with GFR   Hemoglobin A1c   Lipid panel     No follow-ups on file.     Selinda JINNY Ku, MD, [facility]   Primary Care Sports Medicine Primary Care and Sports Medicine at [facility]   "

## 2024-09-13 ENCOUNTER — Ambulatory Visit: Payer: Self-pay | Admitting: Family Medicine

## 2024-09-13 LAB — CBC
Hematocrit: 36.2 % (ref 34.0–46.6)
Hemoglobin: 11.5 g/dL (ref 11.1–15.9)
MCH: 27.8 pg (ref 26.6–33.0)
MCHC: 31.8 g/dL (ref 31.5–35.7)
MCV: 87 fL (ref 79–97)
Platelets: 262 x10E3/uL (ref 150–450)
RBC: 4.14 x10E6/uL (ref 3.77–5.28)
RDW: 13.1 % (ref 11.7–15.4)
WBC: 7.6 x10E3/uL (ref 3.4–10.8)

## 2024-09-13 LAB — COMPREHENSIVE METABOLIC PANEL WITH GFR
ALT: 11 IU/L (ref 0–32)
AST: 13 IU/L (ref 0–40)
Albumin: 4.4 g/dL (ref 3.9–4.9)
Alkaline Phosphatase: 78 IU/L (ref 41–116)
BUN/Creatinine Ratio: 19 (ref 9–23)
BUN: 12 mg/dL (ref 6–20)
Bilirubin Total: 0.3 mg/dL (ref 0.0–1.2)
CO2: 21 mmol/L (ref 20–29)
Calcium: 9.4 mg/dL (ref 8.7–10.2)
Chloride: 105 mmol/L (ref 96–106)
Creatinine, Ser: 0.62 mg/dL (ref 0.57–1.00)
Globulin, Total: 3.1 g/dL (ref 1.5–4.5)
Glucose: 86 mg/dL (ref 70–99)
Potassium: 4 mmol/L (ref 3.5–5.2)
Sodium: 140 mmol/L (ref 134–144)
Total Protein: 7.5 g/dL (ref 6.0–8.5)
eGFR: 118 mL/min/1.73

## 2024-09-13 LAB — LIPID PANEL
Chol/HDL Ratio: 3.4 ratio (ref 0.0–4.4)
Cholesterol, Total: 161 mg/dL (ref 100–199)
HDL: 47 mg/dL
LDL Chol Calc (NIH): 79 mg/dL (ref 0–99)
Triglycerides: 207 mg/dL — ABNORMAL HIGH (ref 0–149)
VLDL Cholesterol Cal: 35 mg/dL (ref 5–40)

## 2024-09-13 LAB — HEMOGLOBIN A1C
Est. average glucose Bld gHb Est-mCnc: 94 mg/dL
Hgb A1c MFr Bld: 4.9 % (ref 4.8–5.6)

## 2024-09-14 ENCOUNTER — Telehealth: Payer: Self-pay | Admitting: Family Medicine

## 2024-09-14 NOTE — Telephone Encounter (Signed)
 Please fill paperwork out for patient to be faxed. Thank you.  JM

## 2024-09-14 NOTE — Telephone Encounter (Signed)
 Copied from CRM #[id number]. Topic: Medical Record Request - Other >> Sep 12, 2024 11:12 AM Suzen RAMAN wrote: Reason for CRM: Facility calling to confirm receipt of a disability form faxed on 09/04/24 to office. No indication in system that information was received and scanned in. Provided caller with alternate fax number. Caller will re-fax information and call back to confirm receipt at a later date. >> Sep 14, 2024 10:09 AM Montie POUR wrote: Tawni with [employer] would like a callback at [phone]. She wants to check on the disability paperwork that was faxed on 09/12/24 to make sure clinic received the paperwork. Call Reference number [id number].

## 2024-09-20 ENCOUNTER — Encounter: Payer: Self-pay | Admitting: Family Medicine

## 2024-09-20 NOTE — Telephone Encounter (Signed)
 LMOM informing patient paperwork was faxed. If she has any questions to call or [facility] message.  JM

## 2024-09-23 ENCOUNTER — Encounter: Payer: Self-pay | Admitting: Family Medicine

## 2024-09-23 ENCOUNTER — Ambulatory Visit (INDEPENDENT_AMBULATORY_CARE_PROVIDER_SITE_OTHER): Admitting: Family Medicine

## 2024-09-23 VITALS — BP 110/88 | HR 85 | Ht 60.0 in | Wt 167.0 lb

## 2024-09-23 DIAGNOSIS — S93402D Sprain of unspecified ligament of left ankle, subsequent encounter: Secondary | ICD-10-CM | POA: Diagnosis not present

## 2024-09-23 DIAGNOSIS — S93409A Sprain of unspecified ligament of unspecified ankle, initial encounter: Secondary | ICD-10-CM

## 2024-09-23 DIAGNOSIS — M1711 Unilateral primary osteoarthritis, right knee: Secondary | ICD-10-CM | POA: Diagnosis not present

## 2024-09-23 DIAGNOSIS — M549 Dorsalgia, unspecified: Secondary | ICD-10-CM

## 2024-09-26 DIAGNOSIS — M1711 Unilateral primary osteoarthritis, right knee: Secondary | ICD-10-CM | POA: Insufficient documentation

## 2024-09-26 NOTE — Patient Instructions (Signed)
 VISIT SUMMARY:  You came in today for a follow-up on your persistent musculoskeletal symptoms, including left ankle pain and instability, right knee pain and swelling, and chronic spine pain.  YOUR PLAN:  LEFT ANKLE GRADE II LIGAMENT SPRAIN WITH PARTIAL TEAR: You have a grade II sprain in your left ankle, which is improving but still causing soreness and instability. -Transition to a lace-up ASO ankle brace for support. -Start physical therapy to strengthen and stabilize your ankle. -Use the walking boot at home as needed and the brace when outside. -Take diclofenac  twice daily for inflammation and pain. -Follow up in six weeks to reassess and consider an MRI if symptoms persist. -Communicate with physical therapy about your work activities and footwear. -Consider chiropractic care for pain control, but focus on physical therapy for recovery. -You can obtain the brace through the clinic or [employer].  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 -Start physical therapy to improve patellofemoral mechanics and quadriceps flexibility. -Take diclofenac  for knee pain and inflammation. -Follow up in six weeks to monitor progress and consider an MRI if symptoms persist. -Communicate with physical therapy about specific activities and work requirements.  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 -Start physical therapy for evaluation and treatment of your spine pain. -Discuss all symptomatic areas with physical therapy for comprehensive rehabilitation. -Follow up in six weeks to reassess spine symptoms and determine the need for further intervention.

## 2024-09-26 NOTE — Progress Notes (Signed)
 "    Primary Care / Sports Medicine Office Visit  Patient Information:  Patient ID: Jean Mckay, female DOB: [birth year] Age: 39 y.o. MRN: [id number]   Jean Mckay is a pleasant 39 y.o. female presenting with the following:  Chief Complaint  Patient presents with   Grade 2 ankle sprain    Still hurting, pain still the same, needs boot for comfort     Vitals:   09/23/24 1444  BP: 110/88  Pulse: 85  SpO2: 98%   Vitals:   09/23/24 1444  Weight: 167 lb (75.8 kg)  Height: 5' (1.524 m)   Body mass index is 32.61 kg/m.  No results found.   Discussed the use of AI scribe software for clinical note transcription with the patient, who gave verbal consent to proceed.   Independent interpretation of notes and tests performed by another provider:   Radiology Left ankle x-ray (08/30/2024): Normal osseous structures (Independently interpreted) Right knee x-ray (05/29/2024): Mild medial compartment joint space narrowing consistent with osteoarthritis; patellar maltracking with superior displacement of patella; no lateral compartment arthritis; no mechanical obstruction (Independently interpreted)  Procedures performed:   None  Pertinent History, Exam, Impression, and Recommendations:   History of Present Illness Jean Mckay is a 39 year old female with left ankle grade 2 ligament sprain, right knee patellofemoral syndrome and osteoarthritis, and chronic spine pain who presents for follow-up of persistent musculoskeletal symptoms.  Left Ankle Pain and Instability: - Sustained injury on August 13, 2024; diagnosed with grade 2 ligament sprain with partial tearing on August 30, 2024 - X-rays negative for fracture - Initially treated with walking boot, transitioned to Ace wrap (uncomfortable) - Persistent aching, soreness, and sensation of instability, especially at night - Palpable nodule present in the area - Ongoing clicking and  popping - Able to rotate ankle but continues to experience pain and sense of looseness - Has not started physical therapy; chiropractic care scheduled  Right Knee Pain and Swelling: - Chronic pain and swelling aggravated by pressure and activity - Recurrent bruising and swelling above the patella - Discomfort described as aching - History of trauma to the right knee - No locking or mechanical symptoms - Pain with single-leg squatting and certain movements - Remains ambulatory despite symptoms  Chronic Spine Pain and Radiculopathy: - Persistent pain involving cervical, thoracic, and lumbar regions since at least August 2024 - 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 No erythema or ecchymosis. PALPATION: Trace to 1+ effusion. Non-tender at medial and lateral patellar facets. Trace tenderness at the quadriceps tendon and lateral joint line. Non-tender at the medial joint line, patellar tendon, and tibial tuberosity. No warmth or crepitus. RANGE OF MOTION: 0-120 degrees, limited by quadriceps tightness. STRENGTH: Quadriceps and hamstring strength preserved. Resisted hip flexion does not reproduce anterior knee pain. SPECIAL TESTS: Negative anterior and posterior drawer tests. Negative medial and lateral McMurray tests. Negative varus and valgus stress tests. Single-leg squat reproduces symptoms.  LEFT ANKLE INSPECTION: No swelling, erythema, or deformity. PALPATION: Trace residual tenderness at the ATFL. Otherwise non-tender throughout the ankle and foot. RANGE OF MOTION: Full active and passive range of motion without pain or limitation. STRENGTH: 5/5 strength in dorsiflexion, plantarflexion, inversion, and eversion. SPECIAL TESTS: Negative talar tilt test.  No instability appreciated.  Assessment and Plan Left  ankle grade II ligament sprain with partial tear Subacute grade II sprain with partial ligament tear, improving but symptomatic with soreness, instability, and palpable knot. Risk of further instability without rehabilitation. - Transitioned to lace-up ASO ankle brace for support. - Referred to physical therapy for ankle strengthening and stability restoration. - Instructed to use boot at home as needed and brace outside. - Advised diclofenac  twice daily for inflammation and pain. - Provided guidance on risk of recurrent injury without rehabilitation. - Scheduled follow-up in six weeks to reassess and consider MRI if symptoms persist. - Instructed to communicate with physical therapy regarding work activities and footwear. - Discussed chiropractic care for pain control, emphasized physical therapy for recovery. - Provided instructions for obtaining brace through clinic or [employer].  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 Imaging shows mild medial compartment osteoarthritis and patellar maltracking with quadriceps tightness. - Referred to physical therapy for knee rehabilitation focusing on patellofemoral mechanics and quadriceps flexibility. - Instructed to use diclofenac  for knee pain and inflammation. - Scheduled follow-up in six weeks to monitor progress and consider MRI if symptoms persist. - Advised to communicate with physical therapy regarding specific activities and work requirements.  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 - Referred to physical therapy for evaluation and treatment of spine pain. - Instructed to discuss all symptomatic areas with physical therapy for  comprehensive rehabilitation. - Scheduled follow-up in six weeks to reassess spine symptoms and determine need for further intervention.  Problem List Items Addressed This Visit     Grade 2 ankle sprain - Primary   Multilevel spine pain   Osteoarthritis of right knee     Orders & Medications Medications: No orders of the defined types were placed in this encounter.  No orders of the defined types were placed in this encounter.    No follow-ups on file.     Selinda JINNY Ku, MD, [facility]   Primary Care Sports Medicine Primary Care and Sports Medicine at [facility]   "

## 2024-10-04 ENCOUNTER — Other Ambulatory Visit: Payer: Self-pay

## 2024-10-04 DIAGNOSIS — S93409A Sprain of unspecified ligament of unspecified ankle, initial encounter: Secondary | ICD-10-CM

## 2024-10-06 ENCOUNTER — Ambulatory Visit: Attending: Family Medicine

## 2024-10-06 DIAGNOSIS — M25672 Stiffness of left ankle, not elsewhere classified: Secondary | ICD-10-CM | POA: Diagnosis present

## 2024-10-06 DIAGNOSIS — S93409A Sprain of unspecified ligament of unspecified ankle, initial encounter: Secondary | ICD-10-CM | POA: Diagnosis not present

## 2024-10-06 DIAGNOSIS — M25572 Pain in left ankle and joints of left foot: Secondary | ICD-10-CM | POA: Insufficient documentation

## 2024-10-06 NOTE — Therapy (Signed)
 " OUTPATIENT PHYSICAL THERAPY ANKLE EVALUATION   Patient Name: Jean Mckay MRN: [id number] DOB:[birth year], 39 y.o., female Today's Date: 10/07/2024  END OF SESSION:  PT End of Session - 10/06/24 1106     Visit Number 1    Number of Visits 17    Date for Recertification  12/01/24    Authorization Type eval: 10/06/24;    PT Start Time 1110    PT Stop Time 1145    PT Time Calculation (min) 35 min    Activity Tolerance Patient tolerated treatment well    Behavior During Therapy [facility] for tasks assessed/performed         Past Medical History:  Diagnosis Date   Anemia    Previous pregnancy   Anxiety    Endometriosis 2012   Gestational diabetes    controlled on diet with G2   Post partum depression 2007   Past Surgical History:  Procedure Laterality Date   CESAREAN SECTION  05/16/2014   FTP   CESAREAN SECTION N/A 06/04/2017   Procedure: CESAREAN SECTION;  Surgeon: Leonce Garnette BIRCH, MD;  Location: [facility] ORS;  Service: Obstetrics;  Laterality: N/A;   WISDOM TOOTH EXTRACTION     Patient Active Problem List   Diagnosis Date Noted   Osteoarthritis of right knee 09/26/2024   Grade 2 ankle sprain 09/02/2024   RLQ abdominal pain 07/14/2024   Routine screening for STI (sexually transmitted infection) 07/14/2024   Intractable episodic headache 10/02/2023   Multilevel spine pain 05/08/2023   Toenail deformity 05/08/2023   Dysphagia 04/03/2023   Infected abrasion of great toe of right foot 04/03/2023   Fibroids, submucosal 03/04/2023   Anemia 03/03/2023   Primary hypertension 03/03/2023   Elevated fasting glucose 03/03/2023   Pelvic pain 03/03/2023   Anxiety with depression 04/29/2022   Hx of postpartum depression 2007 06/03/2019   Smoker 5-15 cpd 06/03/2019    PCP: [name] MD  REFERRING PROVIDER: [name] MD  REFERRING DIAG: [id number] (ICD-10-CM) - Grade 2 ankle sprain   Rationale for Evaluation and Treatment: Rehabilitation  THERAPY DIAG:  Pain in left ankle and joints of left foot  Stiffness of left ankle, not elsewhere classified  ONSET DATE: 08/13/24  FOLLOW-UP APPT SCHEDULED WITH REFERRING PROVIDER: Yes, 11/04/24   SUBJECTIVE:                                                                                                                                                                                         SUBJECTIVE STATEMENT:  L ankle inversion sprain 08/13/24.  PERTINENT HISTORY:  Pt fell off a step resulting in a L ankle inversion injury on  08/13/24. She went to an urgent care and was placed in a small boot and crutches. Plain film radiographs were negative for actue fracture at that time. She has not had any additional imaging since the initial radiographs. Pt never used the crutches because she found it difficult to balance. She eventually saw her PCP who diagnosed her with a grade II sprain and placed her in a walking boot ultimately transitioning into a lace-up ASO brace. She has experienced some improvement after the initial injury however recently the improvements have reached a plateau. Pt continues with to notice some swelling on the lateral aspect of her ankle. She saw her PCP for a follow-up visit recently at which time she was referred to PT. Pt states that she has been to see the chiropractor since her injury who was performing manipulations on her ankle without notable benefit. She continues to report aching with the primary area of discomfort being focused mostly to her lateral ankle. She has a history of a R ankle sprain last summer which improved relatively quickly. History of persistent pain involving cervical, thoracic, and lumbar regions since at least August 2024. 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 History of chronic R knee pain and swelling as well. She is scheduled to follow-up in six weeks to reassess and consider MRI if symptoms  persist.  Radiology Left ankle x-ray (08/30/2024): Normal osseous structures (Independently interpreted by PCP) Right knee x-ray (05/29/2024): Mild medial compartment joint space narrowing consistent with osteoarthritis; patellar maltracking with superior displacement of patella; no lateral compartment arthritis; no mechanical obstruction (Independently interpreted by PCP)  PAIN:    Pain Intensity: Present: 5/10, Best: 2/10, Worst: 8/10 Pain location: Lateral L ankle Pain Quality: Achy and sharp Radiating: No  Swelling: Yes, occasional lateral ankle swelling; Popping, catching, locking: Yes, occasional popping (pain relieving), no locking Numbness/Tingling: No Focal Weakness: Yes, occasional ankle weakness Aggravating factors: walking, stepping, moving quickly Relieving factors: pain medication, wearing her boot and lace-up brace, ibuprofen , ice, elevation; 24-hour pain behavior: pain is worse at night and occasionally wakes her up at night; History of prior back, hip, knee, or ankle injury, pain, surgery, or therapy: Yes, history of chronic back pain as well as R knee pain. No issues with L knee or hip; Dominant hand: right Imaging: Yes, see history; Typical footwear: steel toe boots or cowboy boots at work; [employer] flags: Negative for personal history of cancer, chills/fever, night sweats, nausea, vomiting;  PRECAUTIONS: None  WEIGHT BEARING RESTRICTIONS: No  FALLS: Has patient fallen in last 6 months? Yes. 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 Usual hobbies, recreational or sporting activities 0  3. Getting into/out of the bath 1  4. Walking between rooms 1  5.  Putting on socks/shoes 0  6. Squatting  1  7. Lifting an object, like a bag of groceries from the floor 1  8. Performing light activities around your home 1  9. Performing heavy activities around your home 2  10. Getting into/out of a car 1  11. Walking 2 blocks 1  12. Walking 1 mile 0  13. Going up/down 10 stairs (1 flight) 0  14. Standing for 1 hour 1  15.  sitting for 1 hour 1  16. Running on even ground 1  17. Running on uneven ground 0  18. Making sharp turns while running fast 0  19. Hopping  0  20. Rolling over in bed 1  Score total:  12/80    Cognition Patient is oriented to person, place, and time.  Recent memory is intact.  Remote memory is intact.  Attention span and concentration are intact.  Expressive speech is intact.  Patient's fund of knowledge is within normal limits for educational level.    Gross Musculoskeletal Assessment Bulk: Normal Tone: Normal No trophic changes noted to foot/ankle. No ecchymosis, erythema, or edema noted. No gross ankle/foot deformity noted  GAIT: Distance walked: 40' Assistive device utilized: None Level of assistance: Complete Independence Comments: No excessive pronation/supination noted during gait. Antalgic gait with decreased stance time on LLE.   AROM AROM (Normal range in degrees) AROM   Ankle Right Left  Dorsiflexion (20), measured in sitting 8 2  Plantarflexion (50) 46 48  Inversion (35) 20 14  Eversion (15) 8 6  (* = pain; Blank rows = not tested)  LE MMT: MMT (out of 5) Right   Left   Hip flexion 4+ 4+  Hip extension    Hip abduction    Hip adduction    Hip internal rotation 4+ 4+  Hip external rotation 4+ 4+  Knee flexion (seated) 4+ 4+  Knee extension 5 5  Ankle dorsiflexion 5 5  Ankle plantarflexion 5 5  Ankle inversion 5 5  Ankle eversion 5 5  (* = pain; Blank rows = not tested)  Sensation Deferred  Reflexes Deferred  Muscle Length Deferred  Palpation General tenderness to palpation around anterior and lateral ankle as well as along peroneus longus and brevis muscles above ankle. No pinpoint tenderness of over distal fibula (lateral malleolus) or distal tibia (medial malleolus);  Passive Accessory Motion Deferred  VASCULAR Deferred  SPECIAL TESTS Ligamentous Integrity Anterior Drawer (ATF, 10-15 plantarflexion with anterior translation): Negative Talar Tilt (CFL, inversion): Negative Eversion Stress Test (Deltoid, eversion): Negative External Rotation Test (High ankle, dorsiflexion and external rotation): Negative Squeeze Test (High ankle): Negative Impingment Sign (Dorsiflexion and eversion): Negative  Achilles Integrity Thompson Test: Not examined, achilles tendon intact with active plantarflexion;  Fracture Screening Metatarsal Axial Loading: Not examined Tap/Percussion Test: Negative Vibration Test: Not examined  Pronation/Supination Navicular Drop: Not examined  Nerve Test Tarsal Tunnel Test (maximal DF, EV, toe ext with tapping over tarsal tunnel): Not examined Test for Morton's Neuroma (compress metatarsals and mobilize): Not examined  Other Windlass Mechanism Test: Not examined   Beighton Scale Deferred   TODAY'S TREATMENT: DATE:   Self-Care/Home Management Training Education about HEP, self-care, sleep, and bracing;    PATIENT EDUCATION:  Education details: See above Person educated: Patient Education method: Explanation Education comprehension: verbalized understanding   HOME EXERCISE PROGRAM:   Pt verbally instructed in ankle AROM alphabet and circumduction, will provided written HEP at first follow-up visit;  ASSESSMENT:  CLINICAL IMPRESSION: Patient is a  39 y.o. female who was seen today for physical therapy evaluation and treatment for grade II L ankle sprain. No ligamentous instability noted with testing and pain-free strength testing in DF, inversion, and eversion. General tenderness to palpation along anterior and lateral ankle without any trophic or color changes. Loss of L ankle ROM in DF, inversion, and eversion as well.   OBJECTIVE IMPAIRMENTS: Abnormal gait, decreased activity tolerance, difficulty walking, decreased ROM, and pain.   ACTIVITY LIMITATIONS: standing, squatting, sleeping, stairs, and caring for others  PARTICIPATION LIMITATIONS: meal prep, cleaning, laundry, shopping, community activity, and occupation  PERSONAL FACTORS: Past/current experiences, Time since onset of injury/illness/exacerbation, and 3+ comorbidities: anxiety/depression, OA, and multilevel spine pain are also affecting patient's functional outcome.   REHAB POTENTIAL: Good  CLINICAL DECISION MAKING: Evolving/moderate complexity  EVALUATION COMPLEXITY: Moderate   GOALS: Goals reviewed with patient? No  SHORT TERM GOALS: Target date: 11/04/2024  Pt will be independent with HEP to improve strength and decrease ankle pain to improve pain-free function at home and work. Baseline:  Goal status: INITIAL   LONG TERM GOALS: Target date: 12/02/2024  Pt will increase LEFS score to at least 60/80 in order demonstrate clinically significant reduction in ankle pain/disability so she can return to work and leisure activities;    Baseline: 12/80 Goal status: INITIAL  2.  Pt will decrease worst ankle pain by at least 3 points on the NPRS in order to demonstrate clinically significant reduction in ankle pain. Baseline: 8/10; Goal status: INITIAL  3.  Pt will increase L ankle AROM dorsiflexion  and inversion so they are symmetrical to the R side in order to demonstrate improvement function.  Baseline: see measurements in note; Goal status: INITIAL  4.  Pt will report at least 75% improvement in ankle symptoms in order to return to work and improve pain-free function at home and with leisure activities such as walking and dancing. Baseline: Goal status: INITIAL   PLAN: PT FREQUENCY: 1-2x/week  PT DURATION: 8 weeks  PLANNED INTERVENTIONS: Therapeutic exercises, Therapeutic activity, Neuromuscular re-education, Balance training, Gait training, Patient/Family education, Self Care, Joint mobilization, Joint manipulation, Vestibular training, Canalith repositioning, Orthotic/Fit training, DME instructions, Dry Needling, Electrical stimulation, Spinal manipulation, Spinal mobilization, Cryotherapy, Moist heat, Taping, Traction, Ultrasound, Ionotophoresis 4mg /ml Dexamethasone , Manual therapy, and Re-evaluation.  PLAN FOR NEXT SESSION: PAM and sensation testing, measure WB DF, finish strength testing for hips, issue written HEP, progress manual techniques and strengthening;   Selinda BIRCH Alicea Wente PT, DPT, GCS  Kaylyne Axton, PT 10/07/2024, 4:28 PM  "

## 2024-10-11 ENCOUNTER — Ambulatory Visit

## 2024-10-11 DIAGNOSIS — M25572 Pain in left ankle and joints of left foot: Secondary | ICD-10-CM

## 2024-10-11 DIAGNOSIS — M25672 Stiffness of left ankle, not elsewhere classified: Secondary | ICD-10-CM

## 2024-10-11 NOTE — Therapy (Signed)
 " OUTPATIENT PHYSICAL THERAPY ANKLE TREATMENT   Patient Name: Jean Mckay MRN: [id number] DOB:Jun 30, 1986, 39 y.o., female Today's Date: 10/11/2024  END OF SESSION:  PT End of Session - 10/11/24 1143     Visit Number 2    Number of Visits 17    Date for Recertification  12/01/24    Authorization Type eval: 10/06/24;    PT Start Time 1145    PT Stop Time 1230    PT Time Calculation (min) 45 min    Activity Tolerance Patient tolerated treatment well    Behavior During Therapy [facility] for tasks assessed/performed         Past Medical History:  Diagnosis Date   Anemia    Previous pregnancy   Anxiety    Endometriosis 2012   Gestational diabetes    controlled on diet with G2   Post partum depression 2007   Past Surgical History:  Procedure Laterality Date   CESAREAN SECTION  05/16/2014   FTP   CESAREAN SECTION N/A 06/04/2017   Procedure: CESAREAN SECTION;  Surgeon: Jean Garnette BIRCH, MD;  Location: [facility] ORS;  Service: Obstetrics;  Laterality: N/A;   WISDOM TOOTH EXTRACTION     Patient Active Problem List   Diagnosis Date Noted   Osteoarthritis of right knee 09/26/2024   Grade 2 ankle sprain 09/02/2024   RLQ abdominal pain 07/14/2024   Routine screening for STI (sexually transmitted infection) 07/14/2024   Intractable episodic headache 10/02/2023   Multilevel spine pain 05/08/2023   Toenail deformity 05/08/2023   Dysphagia 04/03/2023   Infected abrasion of great toe of right foot 04/03/2023   Fibroids, submucosal 03/04/2023   Anemia 03/03/2023   Primary hypertension 03/03/2023   Elevated fasting glucose 03/03/2023   Pelvic pain 03/03/2023   Anxiety with depression 04/29/2022   Hx of postpartum depression 2007 06/03/2019   Smoker 5-15 cpd 06/03/2019   PCP: Jean Ku MD  REFERRING PROVIDER: [name] MD  REFERRING DIAG: [phone] (ICD-10-CM) - Grade 2 ankle sprain   Rationale for Evaluation and Treatment: Rehabilitation  THERAPY DIAG: Pain  in left ankle and joints of left foot  Stiffness of left ankle, not elsewhere classified  ONSET DATE: 08/13/24  FOLLOW-UP APPT SCHEDULED WITH REFERRING PROVIDER: Yes, 11/04/24  FROM INITIAL EVALUATION SUBJECTIVE:                                                                                                                                                                                         SUBJECTIVE STATEMENT:  L ankle inversion sprain 08/13/24.  PERTINENT HISTORY:  Pt fell off a step resulting in a L ankle inversion injury  on 08/13/24. She went to an urgent care and was placed in a small boot and crutches. Plain film radiographs were negative for actue fracture at that time. She has not had any additional imaging since the initial radiographs. Pt never used the crutches because she found it difficult to balance. She eventually saw her PCP who diagnosed her with a grade II sprain and placed her in a walking boot ultimately transitioning into a lace-up ASO brace. She has experienced some improvement after the initial injury however recently the improvements have reached a plateau. Pt continues with to notice some swelling on the lateral aspect of her ankle. She saw her PCP for a follow-up visit recently at which time she was referred to PT. Pt states that she has been to see the chiropractor since her injury who was performing manipulations on her ankle without notable benefit. She continues to report aching with the primary area of discomfort being focused mostly to her lateral ankle. She has a history of a R ankle sprain last summer which improved relatively quickly. History of persistent pain involving cervical, thoracic, and lumbar regions since at least August 2024. 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 History of chronic R knee pain and swelling as well. She is scheduled to follow-up in six weeks to reassess and consider MRI if symptoms  persist.  Radiology Left ankle x-ray (08/30/2024): Normal osseous structures (Independently interpreted by PCP) Right knee x-ray (05/29/2024): Mild medial compartment joint space narrowing consistent with osteoarthritis; patellar maltracking with superior displacement of patella; no lateral compartment arthritis; no mechanical obstruction (Independently interpreted by PCP)  PAIN:    Pain Intensity: Present: 5/10, Best: 2/10, Worst: 8/10 Pain location: Lateral L ankle Pain Quality: Achy and sharp Radiating: No  Swelling: Yes, occasional lateral ankle swelling; Popping, catching, locking: Yes, occasional popping (pain relieving), no locking Numbness/Tingling: No Focal Weakness: Yes, occasional ankle weakness Aggravating factors: walking, stepping, moving quickly Relieving factors: pain medication, wearing her boot and lace-up brace, ibuprofen , ice, elevation; 24-hour pain behavior: pain is worse at night and occasionally wakes her up at night; History of prior back, hip, knee, or ankle injury, pain, surgery, or therapy: Yes, history of chronic back pain as well as R knee pain. No issues with L knee or hip; Dominant hand: right Imaging: Yes, see history; Typical footwear: steel toe boots or cowboy boots at work; [employer] flags: Negative for personal history of cancer, chills/fever, night sweats, nausea, vomiting;  PRECAUTIONS: None  WEIGHT BEARING RESTRICTIONS: No  FALLS: Has patient fallen in last 6 months? Yes. 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 Usual hobbies, recreational or sporting activities 0  3. Getting into/out of the bath 1  4. Walking between rooms 1  5. Putting on socks/shoes 0  6. Squatting  1  7. Lifting an object, like a bag of groceries from the floor 1  8. Performing light activities around your home 1  9. Performing heavy activities around your home 2  10. Getting into/out of a car 1  11. Walking 2 blocks 1  12. Walking 1 mile 0  13. Going up/down 10 stairs (1 flight) 0  14. Standing for 1 hour 1  15.  sitting for 1 hour 1  16. Running on even ground 1  17. Running on uneven ground 0  18. Making sharp turns while running fast 0  19. Hopping  0  20. Rolling over in bed 1  Score total:  12/80    Cognition Patient is oriented to person, place, and time.  Recent memory is intact.  Remote memory is intact.  Attention span and concentration are intact.  Expressive speech is intact.  Patient's fund of knowledge is within normal limits for educational level.    Gross Musculoskeletal Assessment Bulk: Normal Tone: Normal No trophic changes noted to foot/ankle. No ecchymosis, erythema, or edema noted. No gross ankle/foot deformity noted  GAIT: Distance walked: 40' Assistive device utilized: None Level of assistance: Complete Independence Comments: No excessive pronation/supination noted during gait. Antalgic gait with decreased stance time on LLE.   AROM AROM (Normal range in degrees) AROM   Ankle Right Left  Dorsiflexion (20), measured in sitting 8 2  Plantarflexion (50) 46 48  Inversion (35) 20 14  Eversion (15) 8 6  (* = pain; Blank rows = not tested)  LE MMT: MMT (out of 5) Right   Left   Hip flexion 4+ 4+  Hip extension    Hip abduction    Hip adduction    Hip internal rotation 4+ 4+  Hip external rotation 4+ 4+  Knee flexion (seated) 4+ 4+  Knee extension 5 5  Ankle dorsiflexion 5 5  Ankle plantarflexion 5 5  Ankle inversion 5 5  Ankle eversion 5 5  (* = pain; Blank rows = not tested)  Sensation Deferred  Reflexes Deferred  Muscle Length Deferred  Palpation General tenderness to palpation around anterior and lateral ankle as well as along peroneus longus and brevis muscles above ankle. No pinpoint tenderness of over distal fibula (lateral malleolus) or distal tibia (medial malleolus);  Passive Accessory Motion Deferred  VASCULAR Deferred  SPECIAL TESTS Ligamentous Integrity Anterior Drawer (ATF, 10-15 plantarflexion with anterior translation): Negative Talar Tilt (CFL, inversion): Negative Eversion Stress Test (Deltoid, eversion): Negative External Rotation Test (High ankle, dorsiflexion and external rotation): Negative Squeeze Test (High ankle): Negative Impingment Sign (Dorsiflexion and eversion): Negative  Achilles Integrity Thompson Test: Not examined, achilles tendon intact with active plantarflexion;  Fracture Screening Metatarsal Axial Loading: Not examined Tap/Percussion Test: Negative Vibration Test: Not examined  Pronation/Supination Navicular Drop: Not examined  Nerve Test Tarsal Tunnel Test (maximal DF, EV, toe ext with tapping over tarsal tunnel): Not examined Test for Morton's Neuroma (compress metatarsals and mobilize): Not examined  Other Windlass Mechanism Test: Not examined   Beighton Scale Deferred   TODAY'S TREATMENT:   SUBJECTIVE: Pt reports that she has been having more aching lately with the cold weather. She has been trying to walk without the boot but states that it's hard to walk on uneven surfaces with a lot of pressure on the underside of her foot. 8/10 resting pain upon arrival currently. No  specific questions currently.   PAIN: 8/10;  Ther-ex   LE MMT: MMT (out of 5) Right  Left   Hip extension 3+ 3+  Hip abduction 3+ 3+  Hip adduction 4 4  (* = pain; Blank rows = not tested)  WB DF: 20 degrees bilaterally; Seated L ankle alphabet x 1 cycle; Seated L ankle circles x 10 each direction; Supine L ankle isotonic resisted inversion, dorsiflexion, and plantarflexion 2 x 10 each; Supine L ankle isometric resisted eversion 5s hold 2 x 10 (attempted full istonic but DC secondary to pain at end range eversion); HEP issued and reviewed with patient;   Manual Therapy   Grade I-II AP TC mobilizations with pt in long sitting at available end range dorsiflexion, 30s/bout x 3 bouts; Grade I-II TC distraction mobilizations with pt in long sitting, 30s/bout x 3 bouts; Grade I-II subtalar distraction with pt in long sitting 30s/bout x 3 bouts; STM to L peroneus long/brevis and lateral calf as well as medial calf and posterior to medial malleolus;   PATIENT EDUCATION:  Education details: Pt educated throughout session about proper posture and technique with exercises. Improved exercise technique, movement at target joints, use of target muscles after min to mod verbal, visual, tactile cues. HEP Person educated: Patient Education method: Explanation, Demonstration, Verbal cues, and Handouts Education comprehension: verbalized understanding   HOME EXERCISE PROGRAM:  Access Code: [facility] URL: [email or web address] Date: 10/11/2024 Prepared by: Jean Eck  Exercises - Seated Ankle Alphabet (Mirrored)  - 3 x daily - 7 x weekly - 1 reps - Seated Ankle Circles  - 3 x daily - 7 x weekly - 2 sets - 10 reps - Isometric Ankle Eversion at Wall  - 1 x daily - 7 x weekly - 2 sets - 10 reps - 5s hold - Isometric Ankle Inversion at Wall (Mirrored)  - 1 x daily - 7 x weekly - 2 sets - 10 reps - 5s hold   ASSESSMENT:  CLINICAL IMPRESSION: Initiated strengthening  exercises and manual techniques for desensitization during session today with patient. Issued HEP and reviewed with patient. Pt encouraged to follow-up as scheduled. She will benefit from PT services to address deficits in strength, range of motion, and mobility in order to improve pain-free function at home and work.  OBJECTIVE IMPAIRMENTS: Abnormal gait, decreased activity tolerance, difficulty walking, decreased ROM, and pain.   ACTIVITY LIMITATIONS: standing, squatting, sleeping, stairs, and caring for others  PARTICIPATION LIMITATIONS: meal prep, cleaning, laundry, shopping, community activity, and occupation  PERSONAL FACTORS: Past/current experiences, Time since onset of injury/illness/exacerbation, and 3+ comorbidities: anxiety/depression, OA, and multilevel spine pain are also affecting patient's functional outcome.   REHAB POTENTIAL: Good  CLINICAL DECISION MAKING: Evolving/moderate complexity  EVALUATION COMPLEXITY: Moderate   GOALS: Goals reviewed with patient? No  SHORT TERM GOALS: Target date: 11/08/2024  Pt will be independent with HEP to improve strength and decrease ankle pain to improve pain-free function at home and work. Baseline:  Goal status: INITIAL   LONG TERM GOALS: Target date: 12/06/2024  Pt will increase LEFS score to at least 60/80 in order demonstrate clinically significant reduction in ankle pain/disability so she can return to work and leisure activities;    Baseline: 12/80 Goal status: INITIAL  2.  Pt will decrease worst ankle pain by at least 3 points on the NPRS in order to demonstrate clinically significant reduction in ankle pain. Baseline: 8/10; Goal status: INITIAL  3.  Pt will increase L ankle AROM dorsiflexion and inversion so they are symmetrical to the R  side in order to demonstrate improvement function.  Baseline: see measurements in note; Goal status: INITIAL  4.  Pt will report at least 75% improvement in ankle symptoms in order to  return to work and improve pain-free function at home and with leisure activities such as walking and dancing. Baseline: Goal status: INITIAL   PLAN: PT FREQUENCY: 1-2x/week  PT DURATION: 8 weeks  PLANNED INTERVENTIONS: Therapeutic exercises, Therapeutic activity, Neuromuscular re-education, Balance training, Gait training, Patient/Family education, Self Care, Joint mobilization, Joint manipulation, Vestibular training, Canalith repositioning, Orthotic/Fit training, DME instructions, Dry Needling, Electrical stimulation, Spinal manipulation, Spinal mobilization, Cryotherapy, Moist heat, Taping, Traction, Ultrasound, Ionotophoresis 4mg /ml Dexamethasone , Manual therapy, and Re-evaluation.  PLAN FOR NEXT SESSION: progress manual techniques and strengthening, review/modify HEP as necessary;   Jean Mckay Jean Mckay PT, DPT, GCS  Jean Mckay, PT 10/11/2024, 2:37 PM  "

## 2024-10-13 NOTE — Therapy (Incomplete)
 " OUTPATIENT PHYSICAL THERAPY ANKLE TREATMENT   Patient Name: Jean Mckay MRN: [id number] DOB:[birth year], 39 y.o., female Today's Date: 10/13/2024  END OF SESSION:   Past Medical History:  Diagnosis Date   Anemia    Previous pregnancy   Anxiety    Endometriosis 2012   Gestational diabetes    controlled on diet with G2   Post partum depression 2007   Past Surgical History:  Procedure Laterality Date   CESAREAN SECTION  05/16/2014   FTP   CESAREAN SECTION N/A 06/04/2017   Procedure: CESAREAN SECTION;  Surgeon: Leonce Garnette BIRCH, MD;  Location: [facility] ORS;  Service: Obstetrics;  Laterality: N/A;   WISDOM TOOTH EXTRACTION     Patient Active Problem List   Diagnosis Date Noted   Osteoarthritis of right knee 09/26/2024   Grade 2 ankle sprain 09/02/2024   RLQ abdominal pain 07/14/2024   Routine screening for STI (sexually transmitted infection) 07/14/2024   Intractable episodic headache 10/02/2023   Multilevel spine pain 05/08/2023   Toenail deformity 05/08/2023   Dysphagia 04/03/2023   Infected abrasion of great toe of right foot 04/03/2023   Fibroids, submucosal 03/04/2023   Anemia 03/03/2023   Primary hypertension 03/03/2023   Elevated fasting glucose 03/03/2023   Pelvic pain 03/03/2023   Anxiety with depression 04/29/2022   Hx of postpartum depression 2007 06/03/2019   Smoker 5-15 cpd 06/03/2019   PCP: [name] MD  REFERRING PROVIDER: [name] MD  REFERRING DIAG: [phone] (ICD-10-CM) - Grade 2 ankle sprain   Rationale for Evaluation and Treatment: Rehabilitation  THERAPY DIAG: Pain in left ankle and joints of left foot  Stiffness of left ankle, not elsewhere classified  ONSET DATE: 08/13/24  FOLLOW-UP APPT SCHEDULED WITH REFERRING PROVIDER: Yes, 11/04/24  FROM INITIAL EVALUATION SUBJECTIVE:                                                                                                                                                                                          SUBJECTIVE STATEMENT:  L ankle inversion sprain 08/13/24.  PERTINENT HISTORY:  Pt fell off a step resulting in a L ankle inversion injury on 08/13/24. She went to an urgent care and was placed in a small boot and crutches. Plain film radiographs were negative for actue fracture at that time. She has not had any additional imaging since the initial radiographs. Pt never used the crutches because she found it difficult to balance. She eventually saw her PCP who diagnosed her with a grade II sprain and placed her in a walking boot ultimately transitioning into a lace-up ASO brace. She has experienced some improvement after the  initial injury however recently the improvements have reached a plateau. Pt continues with to notice some swelling on the lateral aspect of her ankle. She saw her PCP for a follow-up visit recently at which time she was referred to PT. Pt states that she has been to see the chiropractor since her injury who was performing manipulations on her ankle without notable benefit. She continues to report aching with the primary area of discomfort being focused mostly to her lateral ankle. She has a history of a R ankle sprain last summer which improved relatively quickly. History of persistent pain involving cervical, thoracic, and lumbar regions since at least August 2024. 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 History of chronic R knee pain and swelling as well. She is scheduled to follow-up in six weeks to reassess and consider MRI if symptoms persist.  Radiology Left ankle x-ray (08/30/2024): Normal osseous structures (Independently interpreted by PCP) Right knee x-ray (05/29/2024): Mild medial compartment joint space narrowing consistent with osteoarthritis; patellar maltracking with superior displacement of patella; no lateral compartment arthritis; no mechanical obstruction (Independently interpreted by  PCP)  PAIN:    Pain Intensity: Present: 5/10, Best: 2/10, Worst: 8/10 Pain location: Lateral L ankle Pain Quality: Achy and sharp Radiating: No  Swelling: Yes, occasional lateral ankle swelling; Popping, catching, locking: Yes, occasional popping (pain relieving), no locking Numbness/Tingling: No Focal Weakness: Yes, occasional ankle weakness Aggravating factors: walking, stepping, moving quickly Relieving factors: pain medication, wearing her boot and lace-up brace, ibuprofen , ice, elevation; 24-hour pain behavior: pain is worse at night and occasionally wakes her up at night; History of prior back, hip, knee, or ankle injury, pain, surgery, or therapy: Yes, history of chronic back pain as well as R knee pain. No issues with L knee or hip; Dominant hand: right Imaging: Yes, see history; Typical footwear: steel toe boots or cowboy boots at work; [employer] flags: Negative for personal history of cancer, chills/fever, night sweats, nausea, vomiting;  PRECAUTIONS: None  WEIGHT BEARING RESTRICTIONS: No  FALLS: Has patient fallen in last 6 months? Yes. 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 Usual hobbies, recreational or sporting activities 0  3.  Getting into/out of the bath 1  4. Walking between rooms 1  5. Putting on socks/shoes 0  6. Squatting  1  7. Lifting an object, like a bag of groceries from the floor 1  8. Performing light activities around your home 1  9. Performing heavy activities around your home 2  10. Getting into/out of a car 1  11. Walking 2 blocks 1  12. Walking 1 mile 0  13. Going up/down 10 stairs (1 flight) 0  14. Standing for 1 hour 1  15.  sitting for 1 hour  1  16. Running on even ground 1  17. Running on uneven ground 0  18. Making sharp turns while running fast 0  19. Hopping  0  20. Rolling over in bed 1  Score total:  12/80    Cognition Patient is oriented to person, place, and time.  Recent memory is intact.  Remote memory is intact.  Attention span and concentration are intact.  Expressive speech is intact.  Patient's fund of knowledge is within normal limits for educational level.    Gross Musculoskeletal Assessment Bulk: Normal Tone: Normal No trophic changes noted to foot/ankle. No ecchymosis, erythema, or edema noted. No gross ankle/foot deformity noted  GAIT: Distance walked: 40' Assistive device utilized: None Level of assistance: Complete Independence Comments: No excessive pronation/supination noted during gait. Antalgic gait with decreased stance time on LLE.   AROM AROM (Normal range in degrees) AROM   Ankle Right Left  Dorsiflexion (20), measured in sitting 8 2  Plantarflexion (50) 46 48  Inversion (35) 20 14  Eversion (15) 8 6  (* = pain; Blank rows = not tested)  LE MMT: MMT (out of 5) Right  Left   Hip flexion 4+ 4+  Hip extension    Hip abduction    Hip adduction    Hip internal rotation 4+ 4+  Hip external rotation 4+ 4+  Knee flexion (seated) 4+ 4+  Knee extension 5 5  Ankle dorsiflexion 5 5  Ankle plantarflexion 5 5  Ankle inversion 5 5  Ankle eversion 5 5  (* = pain; Blank rows = not tested)  Sensation Deferred  Reflexes Deferred  Muscle  Length Deferred  Palpation General tenderness to palpation around anterior and lateral ankle as well as along peroneus longus and brevis muscles above ankle. No pinpoint tenderness of over distal fibula (lateral malleolus) or distal tibia (medial malleolus);  Passive Accessory Motion Deferred  VASCULAR Deferred  SPECIAL TESTS Ligamentous Integrity Anterior Drawer (ATF, 10-15 plantarflexion with anterior translation): Negative Talar Tilt (CFL, inversion): Negative Eversion Stress Test (Deltoid, eversion): Negative External Rotation Test (High ankle, dorsiflexion and external rotation): Negative Squeeze Test (High ankle): Negative Impingment Sign (Dorsiflexion and eversion): Negative  Achilles Integrity Thompson Test: Not examined, achilles tendon intact with active plantarflexion;  Fracture Screening Metatarsal Axial Loading: Not examined Tap/Percussion Test: Negative Vibration Test: Not examined  Pronation/Supination Navicular Drop: Not examined  Nerve Test Tarsal Tunnel Test (maximal DF, EV, toe ext with tapping over tarsal tunnel): Not examined Test for Morton's Neuroma (compress metatarsals and mobilize): Not examined  Other Windlass Mechanism Test: Not examined   Beighton Scale Deferred   TODAY'S TREATMENT:   SUBJECTIVE: Pt reports that she has been having more aching lately with the cold weather. She has been trying to walk without the boot but states that it's hard to walk on uneven surfaces with a lot of pressure on the underside of her foot. 8/10 resting pain upon arrival currently. No specific questions currently.   PAIN: 8/10;   Ther-ex   LE MMT: MMT (out of 5) Right  Left   Hip extension 3+ 3+  Hip abduction 3+ 3+  Hip adduction 4 4  (* = pain; Blank rows = not tested)  WB DF: 20 degrees bilaterally; Seated L ankle alphabet x 1 cycle; Seated L ankle circles x 10 each direction; Supine L ankle isotonic resisted inversion, dorsiflexion, and  plantarflexion 2 x 10 each; Supine L ankle isometric resisted eversion 5s hold 2 x 10 (attempted full istonic  but DC secondary to pain at end range eversion); HEP issued and reviewed with patient;   Manual Therapy   Grade I-II AP TC mobilizations with pt in long sitting at available end range dorsiflexion, 30s/bout x 3 bouts; Grade I-II TC distraction mobilizations with pt in long sitting, 30s/bout x 3 bouts; Grade I-II subtalar distraction with pt in long sitting 30s/bout x 3 bouts; STM to L peroneus long/brevis and lateral calf as well as medial calf and posterior to medial malleolus;   PATIENT EDUCATION:  Education details: Pt educated throughout session about proper posture and technique with exercises. Improved exercise technique, movement at target joints, use of target muscles after min to mod verbal, visual, tactile cues. HEP Person educated: Patient Education method: Explanation, Demonstration, Verbal cues, and Handouts Education comprehension: verbalized understanding   HOME EXERCISE PROGRAM:  Access Code: [facility] URL: [email or web address] Date: 10/11/2024 Prepared by: Selinda Eck  Exercises - Seated Ankle Alphabet (Mirrored)  - 3 x daily - 7 x weekly - 1 reps - Seated Ankle Circles  - 3 x daily - 7 x weekly - 2 sets - 10 reps - Isometric Ankle Eversion at Wall  - 1 x daily - 7 x weekly - 2 sets - 10 reps - 5s hold - Isometric Ankle Inversion at Wall (Mirrored)  - 1 x daily - 7 x weekly - 2 sets - 10 reps - 5s hold   ASSESSMENT:  CLINICAL IMPRESSION: Initiated strengthening exercises and manual techniques for desensitization during session today with patient. Issued HEP and reviewed with patient. Pt encouraged to follow-up as scheduled. She will benefit from PT services to address deficits in strength, range of motion, and mobility in order to improve pain-free function at home and work.  OBJECTIVE IMPAIRMENTS: Abnormal gait, decreased activity  tolerance, difficulty walking, decreased ROM, and pain.   ACTIVITY LIMITATIONS: standing, squatting, sleeping, stairs, and caring for others  PARTICIPATION LIMITATIONS: meal prep, cleaning, laundry, shopping, community activity, and occupation  PERSONAL FACTORS: Past/current experiences, Time since onset of injury/illness/exacerbation, and 3+ comorbidities: anxiety/depression, OA, and multilevel spine pain are also affecting patient's functional outcome.   REHAB POTENTIAL: Good  CLINICAL DECISION MAKING: Evolving/moderate complexity  EVALUATION COMPLEXITY: Moderate   GOALS: Goals reviewed with patient? No  SHORT TERM GOALS: Target date: 11/10/2024  Pt will be independent with HEP to improve strength and decrease ankle pain to improve pain-free function at home and work. Baseline:  Goal status: INITIAL   LONG TERM GOALS: Target date: 12/08/2024  Pt will increase LEFS score to at least 60/80 in order demonstrate clinically significant reduction in ankle pain/disability so she can return to work and leisure activities;    Baseline: 12/80 Goal status: INITIAL  2.  Pt will decrease worst ankle pain by at least 3 points on the NPRS in order to demonstrate clinically significant reduction in ankle pain. Baseline: 8/10; Goal status: INITIAL  3.  Pt will increase L ankle AROM dorsiflexion and inversion so they are symmetrical to the R side in order to demonstrate improvement function.  Baseline: see measurements in note; Goal status: INITIAL  4.  Pt will report at least 75% improvement in ankle symptoms in order to return to work and improve pain-free function at home and with leisure activities such as walking and dancing. Baseline: Goal status: INITIAL   PLAN: PT FREQUENCY: 1-2x/week  PT DURATION: 8 weeks  PLANNED INTERVENTIONS: Therapeutic exercises, Therapeutic activity, Neuromuscular re-education, Balance training, Gait training, Patient/Family education, Self Care, Joint  mobilization,  Joint manipulation, Vestibular training, Canalith repositioning, Orthotic/Fit training, DME instructions, Dry Needling, Electrical stimulation, Spinal manipulation, Spinal mobilization, Cryotherapy, Moist heat, Taping, Traction, Ultrasound, Ionotophoresis 4mg /ml Dexamethasone , Manual therapy, and Re-evaluation.  PLAN FOR NEXT SESSION: progress manual techniques and strengthening, review/modify HEP as necessary;   Selinda BIRCH Sadiq Mccauley PT, DPT, GCS  Ignatz Deis, PT 10/13/2024, 1:07 PM  "

## 2024-10-18 ENCOUNTER — Ambulatory Visit

## 2024-10-18 DIAGNOSIS — M25572 Pain in left ankle and joints of left foot: Secondary | ICD-10-CM

## 2024-10-18 DIAGNOSIS — M25672 Stiffness of left ankle, not elsewhere classified: Secondary | ICD-10-CM

## 2024-10-24 ENCOUNTER — Ambulatory Visit

## 2024-10-26 ENCOUNTER — Ambulatory Visit

## 2024-11-02 ENCOUNTER — Ambulatory Visit

## 2024-11-04 ENCOUNTER — Ambulatory Visit: Admitting: Family Medicine

## 2024-11-08 ENCOUNTER — Ambulatory Visit

## 2024-11-10 ENCOUNTER — Ambulatory Visit
